# Patient Record
Sex: Female | Born: 1950 | Race: White | Hispanic: No | Marital: Married | Smoking: Never smoker
Health system: Southern US, Community
[De-identification: ages and names within clinical notes are randomized; demographics above are authoritative.]

## PROBLEM LIST (undated history)

## (undated) DIAGNOSIS — C801 Malignant (primary) neoplasm, unspecified: Secondary | ICD-10-CM

## (undated) DIAGNOSIS — E7800 Pure hypercholesterolemia, unspecified: Secondary | ICD-10-CM

## (undated) HISTORY — DX: Malignant (primary) neoplasm, unspecified: C80.1

## (undated) HISTORY — PX: ABDOMINAL HYSTERECTOMY: SHX81

## (undated) HISTORY — PX: MASTECTOMY: SHX3

## (undated) HISTORY — PX: BREAST LUMPECTOMY: SHX2

---

## 2008-02-13 ENCOUNTER — Ambulatory Visit: Payer: Self-pay

## 2011-04-23 DIAGNOSIS — K5730 Diverticulosis of large intestine without perforation or abscess without bleeding: Secondary | ICD-10-CM | POA: Insufficient documentation

## 2012-05-02 ENCOUNTER — Ambulatory Visit: Payer: Self-pay | Admitting: General Practice

## 2012-07-11 DIAGNOSIS — Z86010 Personal history of colonic polyps: Secondary | ICD-10-CM | POA: Insufficient documentation

## 2012-07-11 DIAGNOSIS — E7800 Pure hypercholesterolemia, unspecified: Secondary | ICD-10-CM | POA: Insufficient documentation

## 2012-07-11 DIAGNOSIS — R7301 Impaired fasting glucose: Secondary | ICD-10-CM | POA: Insufficient documentation

## 2012-08-14 DIAGNOSIS — M5441 Lumbago with sciatica, right side: Secondary | ICD-10-CM | POA: Insufficient documentation

## 2012-08-14 DIAGNOSIS — M419 Scoliosis, unspecified: Secondary | ICD-10-CM | POA: Insufficient documentation

## 2012-09-09 DIAGNOSIS — N3941 Urge incontinence: Secondary | ICD-10-CM | POA: Insufficient documentation

## 2012-10-10 ENCOUNTER — Ambulatory Visit: Payer: Self-pay | Admitting: General Practice

## 2013-04-21 DIAGNOSIS — M6530 Trigger finger, unspecified finger: Secondary | ICD-10-CM | POA: Insufficient documentation

## 2013-05-08 DIAGNOSIS — L821 Other seborrheic keratosis: Secondary | ICD-10-CM | POA: Insufficient documentation

## 2015-02-13 ENCOUNTER — Encounter (HOSPITAL_COMMUNITY): Payer: Self-pay | Admitting: Emergency Medicine

## 2015-02-13 ENCOUNTER — Emergency Department (INDEPENDENT_AMBULATORY_CARE_PROVIDER_SITE_OTHER): Payer: No Typology Code available for payment source

## 2015-02-13 ENCOUNTER — Emergency Department (INDEPENDENT_AMBULATORY_CARE_PROVIDER_SITE_OTHER)
Admission: EM | Admit: 2015-02-13 | Discharge: 2015-02-13 | Disposition: A | Discharge status: Home / Self Care | Payer: No Typology Code available for payment source | Source: Home / Self Care | Attending: Family Medicine | Admitting: Family Medicine

## 2015-02-13 DIAGNOSIS — J189 Pneumonia, unspecified organism: Secondary | ICD-10-CM | POA: Diagnosis not present

## 2015-02-13 MED ORDER — LIDOCAINE HCL (PF) 1 % IJ SOLN
INTRAMUSCULAR | Status: AC
  Filled 2015-02-13: qty 5

## 2015-02-13 MED ORDER — ALBUTEROL SULFATE (2.5 MG/3ML) 0.083% IN NEBU
INHALATION_SOLUTION | RESPIRATORY_TRACT | Status: AC
  Filled 2015-02-13: qty 6

## 2015-02-13 MED ORDER — PREDNISONE 20 MG PO TABS
60.0000 mg | ORAL_TABLET | Freq: Once | ORAL | Status: AC
  Administered 2015-02-13: 60 mg via ORAL

## 2015-02-13 MED ORDER — CEFDINIR 300 MG PO CAPS: 300.0000 mg | ORAL_CAPSULE | Freq: Two times a day (BID) | ORAL | Status: DC

## 2015-02-13 MED ORDER — IPRATROPIUM BROMIDE 0.02 % IN SOLN
0.5000 mg | Freq: Once | RESPIRATORY_TRACT | Status: AC
  Administered 2015-02-13: 0.5 mg via RESPIRATORY_TRACT

## 2015-02-13 MED ORDER — CEFTRIAXONE SODIUM 1 G IJ SOLR
INTRAMUSCULAR | Status: AC
  Filled 2015-02-13: qty 10

## 2015-02-13 MED ORDER — DOXYCYCLINE HYCLATE 100 MG PO CAPS: 100.0000 mg | ORAL_CAPSULE | Freq: Two times a day (BID) | ORAL | Status: DC

## 2015-02-13 MED ORDER — CEFTRIAXONE SODIUM 1 G IJ SOLR
1.0000 g | Freq: Once | INTRAMUSCULAR | Status: AC
  Administered 2015-02-13: 1 g via INTRAMUSCULAR

## 2015-02-13 MED ORDER — PREDNISONE 20 MG PO TABS
ORAL_TABLET | ORAL | Status: AC
  Filled 2015-02-13: qty 3

## 2015-02-13 MED ORDER — PREDNISONE 50 MG PO TABS: 50.0000 mg | ORAL_TABLET | Freq: Every day | ORAL | Status: DC

## 2015-02-13 MED ORDER — IPRATROPIUM BROMIDE 0.02 % IN SOLN
RESPIRATORY_TRACT | Status: AC
  Filled 2015-02-13: qty 2.5

## 2015-02-13 MED ORDER — ALBUTEROL SULFATE (2.5 MG/3ML) 0.083% IN NEBU
5.0000 mg | INHALATION_SOLUTION | Freq: Once | RESPIRATORY_TRACT | Status: AC
  Administered 2015-02-13: 5 mg via RESPIRATORY_TRACT

## 2015-02-13 NOTE — ED Notes (Signed)
Discussed discharge information with pt and her husband. Pt verbalized understanding of all information including prescriptions. Pt walked out with husband at time of discharge

## 2015-02-13 NOTE — Discharge Instructions (Signed)

## 2015-02-13 NOTE — ED Notes (Signed)
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 Pt had to stop taking antibiotics that were given r/t side affects that she was starting to have. Pt states that her cough is getting worse with no relief.

## 2015-02-13 NOTE — ED Provider Notes (Signed)
CSN: [id number]     Arrival date & time 02/13/15  1055 History   First MD Initiated Contact with Patient 02/13/15 1126     Chief Complaint  Patient presents with  . Cough   (Consider location/radiation/quality/duration/timing/severity/associated sxs/prior Treatment) HPI         64 year old female presents complaining of being sick now for about 10 days. She has cough, congestion, wheezing, chest pain with coughing. She also has sore throat from coughing.about 5 days ago she went to the employee health clinic at her job and they put her on Levaquin and an inhaler, she had to stop the Levaquin because she started to have severe body aches after taking it. Since she stopped it her cough is getting worse. She has only been using her inhaler in the mornings because she was told she can only use it in the mornings. No fever, NVD, or chest pain   History reviewed. No pertinent past medical history. History reviewed. No pertinent past surgical history. History reviewed. No pertinent family history. History  Substance Use Topics  . Smoking status: Never Smoker   . Smokeless tobacco: Not on file  . Alcohol Use: No   OB History    No data available     Review of Systems  Constitutional: Negative for fever and chills.  Respiratory: Positive for cough and wheezing. Negative for shortness of breath.   Cardiovascular: Negative for chest pain.  Gastrointestinal: Negative for nausea, vomiting, abdominal pain and diarrhea.  All other systems reviewed and are negative.   Allergies  Codeine  Home Medications   Prior to Admission medications   Medication Sig Start Date End Date Taking? Authorizing Provider  cefdinir (OMNICEF) 300 MG capsule Take 1 capsule (300 mg total) by mouth 2 (two) times daily. 02/13/15   Liam Graham, PA-C  doxycycline (VIBRAMYCIN) 100 MG capsule Take 1 capsule (100 mg total) by mouth 2 (two) times daily. 02/13/15   Liam Graham, PA-C  Ergocalciferol (VITAMIN D2 PO) Take  1.25 mg by mouth.   Yes Historical Provider, MD  predniSONE (DELTASONE) 50 MG tablet Take 1 tablet (50 mg total) by mouth daily with breakfast. 02/13/15   Liam Graham, PA-C  simvastatin (ZOCOR) 20 MG tablet Take 20 mg by mouth daily.   Yes Historical Provider, MD   BP 130/82 mmHg  Pulse 86  Temp(Src) 98.1 F (36.7 C) (Oral)  Resp 18  SpO2 96% Physical Exam  Constitutional: She is oriented to person, place, and time. Vital signs are normal. She appears well-developed and well-nourished. She appears ill. No distress.  HENT:  Head: Normocephalic and atraumatic.  Right Ear: External ear normal.  Left Ear: External ear normal.  Nose: Nose normal.  Mouth/Throat: Oropharynx is clear and moist. No oropharyngeal exudate.  Neck: Normal range of motion. Neck supple.  Cardiovascular: Normal rate, regular rhythm and normal heart sounds.   Pulmonary/Chest: She is in respiratory distress (mild). She has wheezes (Diffuse, expiratory).  Prolonged expiratory component  Lymphadenopathy:    She has no cervical adenopathy.  Neurological: She is alert and oriented to person, place, and time. She has normal strength. Coordination normal.  Skin: Skin is warm and dry. No rash noted. She is not diaphoretic.  Psychiatric: She has a normal mood and affect. Judgment normal.  Nursing note and vitals reviewed.   ED Course  Procedures (including critical care time) Labs Review Labs Reviewed - No data to display  Imaging Review Dg Chest 2 View  02/13/2015  CLINICAL DATA:  Cough and chest congestion for the past 10 days.  EXAM: CHEST  2 VIEW  COMPARISON:  None.  FINDINGS: Normal sized heart. Mild diffuse peribronchial thickening and prominence of the interstitial markings. Mild thoracic spine degenerative changes. Thoracolumbar spine Harrington rods.  IMPRESSION: No acute abnormality. Mild chronic bronchitic changes and mild chronic interstitial lung disease.   Electronically Signed   By: Claudie Revering M.D.    On: 02/13/2015 13:03     patient had a mild vasovagal reaction after deep breathing during the physical exam, this resolved in about 5 minutes  MDM   1. CAP (community acquired pneumonia)    X-ray reveals a right middle lobe infiltrate, with obscurred right heart border, although radiologist read is normal.  she will be given 1 g of Rocephin IM here and discharged with doxycycline and omnicef.  Will also use prednisone given her substantial amount of wheezing, she will continue her inhaler every 4 hours when necessary. Will schedule recheck in 2 days either here or at her primary care provider's office   Liam Graham, PA-C 02/13/15 1307

## 2015-02-16 ENCOUNTER — Emergency Department (INDEPENDENT_AMBULATORY_CARE_PROVIDER_SITE_OTHER)
Admission: EM | Admit: 2015-02-16 | Discharge: 2015-02-16 | Disposition: A | Discharge status: Home / Self Care | Payer: No Typology Code available for payment source | Source: Home / Self Care | Attending: Family Medicine | Admitting: Family Medicine

## 2015-02-16 ENCOUNTER — Emergency Department (INDEPENDENT_AMBULATORY_CARE_PROVIDER_SITE_OTHER): Payer: No Typology Code available for payment source

## 2015-02-16 ENCOUNTER — Encounter (HOSPITAL_COMMUNITY): Payer: Self-pay | Admitting: *Deleted

## 2015-02-16 DIAGNOSIS — J45901 Unspecified asthma with (acute) exacerbation: Secondary | ICD-10-CM

## 2015-02-16 MED ORDER — TRIAMCINOLONE ACETONIDE 40 MG/ML IJ SUSP
40.0000 mg | Freq: Once | INTRAMUSCULAR | Status: AC
  Administered 2015-02-16: 40 mg via INTRAMUSCULAR

## 2015-02-16 MED ORDER — IPRATROPIUM BROMIDE 0.02 % IN SOLN
RESPIRATORY_TRACT | Status: AC
  Filled 2015-02-16: qty 2.5

## 2015-02-16 MED ORDER — METHYLPREDNISOLONE ACETATE 40 MG/ML IJ SUSP
80.0000 mg | Freq: Once | INTRAMUSCULAR | Status: AC
  Administered 2015-02-16: 80 mg via INTRAMUSCULAR

## 2015-02-16 MED ORDER — METHYLPREDNISOLONE 4 MG PO KIT: PACK | ORAL | Status: DC

## 2015-02-16 MED ORDER — IPRATROPIUM BROMIDE 0.02 % IN SOLN
0.5000 mg | Freq: Once | RESPIRATORY_TRACT | Status: AC
  Administered 2015-02-16: 0.5 mg via RESPIRATORY_TRACT

## 2015-02-16 MED ORDER — METHYLPREDNISOLONE ACETATE 80 MG/ML IJ SUSP
INTRAMUSCULAR | Status: AC
  Filled 2015-02-16: qty 1

## 2015-02-16 MED ORDER — TRIAMCINOLONE ACETONIDE 40 MG/ML IJ SUSP
INTRAMUSCULAR | Status: AC
  Filled 2015-02-16: qty 1

## 2015-02-16 MED ORDER — ALBUTEROL SULFATE (2.5 MG/3ML) 0.083% IN NEBU
5.0000 mg | INHALATION_SOLUTION | Freq: Once | RESPIRATORY_TRACT | Status: AC
  Administered 2015-02-16: 5 mg via RESPIRATORY_TRACT

## 2015-02-16 MED ORDER — ALBUTEROL SULFATE (2.5 MG/3ML) 0.083% IN NEBU
INHALATION_SOLUTION | RESPIRATORY_TRACT | Status: AC
  Filled 2015-02-16: qty 6

## 2015-02-16 MED ORDER — IPRATROPIUM BROMIDE 0.06 % NA SOLN
2.0000 | Freq: Four times a day (QID) | NASAL | Status: DC
Start: 2015-02-16 — End: 2017-04-15

## 2015-02-16 NOTE — ED Notes (Signed)
Pt    Here  For  followup   Of  pnuemonia           She  Was   Seen [facility]       3   Days  Ago    And  Was  rx  2  Anti  Biotics             She  Continues  To  Have  A  Cough

## 2015-02-16 NOTE — ED Provider Notes (Signed)
CSN: [id number]     Arrival date & time 02/16/15  1301 History   First MD Initiated Contact with Patient 02/16/15 1327     Chief Complaint  Patient presents with  . Follow-up   (Consider location/radiation/quality/duration/timing/severity/associated sxs/prior Treatment) Patient is a 64 y.o. female presenting with cough. The history is provided by the patient and the spouse.  Cough Cough characteristics:  Non-productive, dry and harsh Severity:  Moderate Onset quality:  Gradual Duration:  2 weeks Timing:  Constant Progression:  Waxing and waning Chronicity:  New Smoker: no   Context: upper respiratory infection and weather changes   Relieved by:  Nothing Associated symptoms: chest pain, rhinorrhea, shortness of breath and wheezing   Associated symptoms: no fever     History reviewed. No pertinent past medical history. History reviewed. No pertinent past surgical history. History reviewed. No pertinent family history. History  Substance Use Topics  . Smoking status: Never Smoker   . Smokeless tobacco: Not on file  . Alcohol Use: No   OB History    No data available     Review of Systems  Constitutional: Negative.  Negative for fever.  HENT: Positive for congestion, postnasal drip and rhinorrhea.   Respiratory: Positive for cough, shortness of breath and wheezing.   Cardiovascular: Positive for chest pain.    Allergies  Codeine  Home Medications   Prior to Admission medications   Medication Sig Start Date End Date Taking? Authorizing Provider  cefdinir (OMNICEF) 300 MG capsule Take 1 capsule (300 mg total) by mouth 2 (two) times daily. 02/13/15   Liam Graham, PA-C  doxycycline (VIBRAMYCIN) 100 MG capsule Take 1 capsule (100 mg total) by mouth 2 (two) times daily. 02/13/15   Liam Graham, PA-C  Ergocalciferol (VITAMIN D2 PO) Take 1.25 mg by mouth.    Historical Provider, MD  ipratropium (ATROVENT) 0.06 % nasal spray Place 2 sprays into both nostrils 4 (four)  times daily. 02/16/15   Billy Fischer, MD  methylPREDNISolone (MEDROL DOSEPAK) 4 MG tablet follow package directions start on thurs, take until finished 02/16/15   Billy Fischer, MD  predniSONE (DELTASONE) 50 MG tablet Take 1 tablet (50 mg total) by mouth daily with breakfast. 02/13/15   Liam Graham, PA-C  simvastatin (ZOCOR) 20 MG tablet Take 20 mg by mouth daily.    Historical Provider, MD   BP 167/95 mmHg  Pulse 95  Temp(Src) 98 F (36.7 C) (Oral)  Resp 16  SpO2 98% Physical Exam  Constitutional: She appears well-nourished. No distress.  HENT:  Right Ear: External ear normal.  Left Ear: External ear normal.  Mouth/Throat: Oropharynx is clear and moist.  Eyes: Conjunctivae are normal. Pupils are equal, round, and reactive to light.  Neck: Normal range of motion. Neck supple.  Cardiovascular: Regular rhythm and normal heart sounds.   Pulmonary/Chest: No respiratory distress. She has wheezes. She has rhonchi. She has rales.  Lymphadenopathy:    She has no cervical adenopathy.  Skin: Skin is warm and dry.  Nursing note and vitals reviewed.   ED Course  Procedures (including critical care time) Labs Review Labs Reviewed - No data to display  Imaging Review Dg Chest 2 View  02/16/2015   CLINICAL DATA:  Cough and chest congestion for 2 weeks.  EXAM: CHEST  2 VIEW  COMPARISON:  02/13/2015  FINDINGS: The heart size and mediastinal contours are within normal limits. Mild central peribronchial thickening again noted. No evidence of pulmonary infiltrate or pleural  effusion. No mass or lymphadenopathy identified. Thoracolumbar posterior spinal fixation rods again seen.  IMPRESSION: No active cardiopulmonary disease.   Electronically Signed   By: Earle Gell M.D.   On: 02/16/2015 14:09     MDM   1. Asthmatic bronchitis with acute exacerbation    Lungs nearly clear at d/c.after neb.   Billy Fischer, MD 02/16/15 [phone]

## 2015-02-16 NOTE — Discharge Instructions (Signed)
Drink plenty of fluids as discussed, use medicine as prescribed, and mucinex or delsym for cough. Return or see your doctor if further problems °

## 2015-03-08 ENCOUNTER — Emergency Department: Payer: Self-pay | Admitting: Emergency Medicine

## 2015-03-31 DIAGNOSIS — D0510 Intraductal carcinoma in situ of unspecified breast: Secondary | ICD-10-CM | POA: Insufficient documentation

## 2015-04-08 DIAGNOSIS — Z833 Family history of diabetes mellitus: Secondary | ICD-10-CM | POA: Insufficient documentation

## 2015-04-28 DIAGNOSIS — C50919 Malignant neoplasm of unspecified site of unspecified female breast: Secondary | ICD-10-CM | POA: Insufficient documentation

## 2015-05-20 ENCOUNTER — Encounter: Payer: Self-pay | Admitting: Emergency Medicine

## 2015-05-20 ENCOUNTER — Emergency Department
Admission: EM | Admit: 2015-05-20 | Discharge: 2015-05-20 | Disposition: A | Discharge status: Home / Self Care | Payer: PRIVATE HEALTH INSURANCE | Attending: Emergency Medicine | Admitting: Emergency Medicine

## 2015-05-20 ENCOUNTER — Ambulatory Visit
Admission: RE | Admit: 2015-05-20 | Discharge: 2015-05-20 | Disposition: A | Discharge status: Home / Self Care | Payer: PRIVATE HEALTH INSURANCE | Source: Ambulatory Visit | Attending: Physician Assistant | Admitting: Physician Assistant

## 2015-05-20 ENCOUNTER — Other Ambulatory Visit: Payer: Self-pay | Admitting: Physician Assistant

## 2015-05-20 DIAGNOSIS — I82621 Acute embolism and thrombosis of deep veins of right upper extremity: Secondary | ICD-10-CM | POA: Diagnosis not present

## 2015-05-20 DIAGNOSIS — M25421 Effusion, right elbow: Secondary | ICD-10-CM | POA: Diagnosis present

## 2015-05-20 DIAGNOSIS — Z79899 Other long term (current) drug therapy: Secondary | ICD-10-CM | POA: Diagnosis not present

## 2015-05-20 DIAGNOSIS — Z792 Long term (current) use of antibiotics: Secondary | ICD-10-CM | POA: Insufficient documentation

## 2015-05-20 DIAGNOSIS — M79621 Pain in right upper arm: Secondary | ICD-10-CM | POA: Diagnosis present

## 2015-05-20 DIAGNOSIS — Z7952 Long term (current) use of systemic steroids: Secondary | ICD-10-CM | POA: Insufficient documentation

## 2015-05-20 DIAGNOSIS — I82629 Acute embolism and thrombosis of deep veins of unspecified upper extremity: Secondary | ICD-10-CM

## 2015-05-20 DIAGNOSIS — I82601 Acute embolism and thrombosis of unspecified veins of right upper extremity: Secondary | ICD-10-CM | POA: Insufficient documentation

## 2015-05-20 HISTORY — DX: Pure hypercholesterolemia, unspecified: E78.00

## 2015-05-20 LAB — CBC WITH DIFFERENTIAL/PLATELET
BASOS ABS: 0.1 10*3/uL (ref 0–0.1)
Basophils Relative: 1 %
EOS ABS: 0.1 10*3/uL (ref 0–0.7)
EOS PCT: 1 %
HCT: 37.4 % (ref 35.0–47.0)
HEMOGLOBIN: 12.7 g/dL (ref 12.0–16.0)
LYMPHS PCT: 28 %
Lymphs Abs: 2 10*3/uL (ref 1.0–3.6)
MCH: 28.8 pg (ref 26.0–34.0)
MCHC: 33.9 g/dL (ref 32.0–36.0)
MCV: 84.9 fL (ref 80.0–100.0)
MONO ABS: 1 10*3/uL — AB (ref 0.2–0.9)
Monocytes Relative: 14 %
Neutro Abs: 4 10*3/uL (ref 1.4–6.5)
Neutrophils Relative %: 56 %
Platelets: 211 10*3/uL (ref 150–440)
RBC: 4.41 MIL/uL (ref 3.80–5.20)
RDW: 15.3 % — ABNORMAL HIGH (ref 11.5–14.5)
WBC: 7.2 10*3/uL (ref 3.6–11.0)

## 2015-05-20 LAB — BASIC METABOLIC PANEL
ANION GAP: 10 (ref 5–15)
BUN: 18 mg/dL (ref 6–20)
CO2: 26 mmol/L (ref 22–32)
Calcium: 9.3 mg/dL (ref 8.9–10.3)
Chloride: 103 mmol/L (ref 101–111)
Creatinine, Ser: 0.86 mg/dL (ref 0.44–1.00)
GFR calc Af Amer: >60 mL/min (ref >60)
GFR calc non Af Amer: >60 mL/min (ref >60)
Glucose, Bld: 114 mg/dL — ABNORMAL HIGH (ref 65–99)
Potassium: 3.6 mmol/L (ref 3.5–5.1)
Sodium: 139 mmol/L (ref 135–145)

## 2015-05-20 LAB — PROTIME-INR
INR: 0.94
Prothrombin Time: 12.8 seconds (ref 11.4–15.0)

## 2015-05-20 MED ORDER — APIXABAN 5 MG PO TABS: 10.0000 mg | ORAL_TABLET | Freq: Two times a day (BID) | ORAL | Status: DC

## 2015-05-20 NOTE — ED Provider Notes (Signed)
[facility] Emergency Department Provider Note  ____________________________________________  Time seen: Approximately 8:00 PM  I have reviewed the triage vital signs and the nursing notes.   HISTORY  Chief Complaint Extremity Pain    HPI Tina Powers is a 64 y.o. female who went to her doctor because she felt a lump in her right upper arm yesterday. She had an ultrasound which showed a DVT in the right upper arm pain. Past medical history history is significant for bilateral mastectomy for cancer and 4 weeks ago. Stressed patient briefly with Dr. Bridgett Larsson and we will start her on L course 10 mg twice a day for 7 days and then decreased to 5 mg twice a day for. Patient will follow up with her regular doctor. Patient has not had any chest pain aside from that related to her mastectomy and that has not changed in character. She has not had any shortness of breath coughing or any other complaints that would suggest a pulmonary embolus.   Past Medical History  Diagnosis Date  . High cholesterol     There are no active problems to display for this patient.   Past Surgical History  Procedure Laterality Date  . Breast lumpectomy    . Abdominal hysterectomy    . Mastectomy      Current Outpatient Rx  Name  Route  Sig  Dispense  Refill  . cefdinir (OMNICEF) 300 MG capsule   Oral   Take 1 capsule (300 mg total) by mouth 2 (two) times daily.   14 capsule   0   . doxycycline (VIBRAMYCIN) 100 MG capsule   Oral   Take 1 capsule (100 mg total) by mouth 2 (two) times daily.   14 capsule   0   . Ergocalciferol (VITAMIN D2 PO)   Oral   Take 1.25 mg by mouth.         Marland Kitchen ipratropium (ATROVENT) 0.06 % nasal spray   Each Nare   Place 2 sprays into both nostrils 4 (four) times daily.   15 mL   1   . methylPREDNISolone (MEDROL DOSEPAK) 4 MG tablet      follow package directions start on thurs, take until finished   21 tablet   0   . predniSONE (DELTASONE) 50  MG tablet   Oral   Take 1 tablet (50 mg total) by mouth daily with breakfast.   4 tablet   0   . simvastatin (ZOCOR) 20 MG tablet   Oral   Take 20 mg by mouth daily.           Allergies Codeine  No family history on file.  Social History History  Substance Use Topics  . Smoking status: Never Smoker   . Smokeless tobacco: Not on file  . Alcohol Use: No    Review of Systems Constitutional: No fever/chills Eyes: No visual changes. ENT: No sore throat. Cardiovascular: Denies chest pain. Except for that related to her mastectomy Respiratory: Denies shortness of breath. Gastrointestinal: No abdominal pain.  No nausea, no vomiting.  No diarrhea.  No constipation. Genitourinary: Negative for dysuria. Musculoskeletal: Negative for back pain. Skin: Negative for rash. Neurological: Negative for headaches, focal weakness or numbness.  10-point ROS otherwise negative.  ____________________________________________   PHYSICAL EXAM:  VITAL SIGNS: ED Triage Vitals  Enc Vitals Group     BP --      Pulse Rate 05/20/15 1533 91     Resp 05/20/15 1533 18  Temp 05/20/15 1533 98.5 F (36.9 C)     Temp Source 05/20/15 1533 Oral     SpO2 05/20/15 1533 98 %     Weight 05/20/15 1533 152 lb (68.947 kg)     Height 05/20/15 1533 5' (1.524 m)     Head Cir --      Peak Flow --      Pain Score --      Pain Loc --      Pain Edu? --      Excl. in [place]? --     Constitutional: Alert and oriented. Well appearing and in no acute distress. Eyes: Conjunctivae are normal. PERRL. EOMI. Head: Atraumatic. Nose: No congestion/rhinnorhea. Mouth/Throat: Mucous membranes are moist.  Oropharynx non-erythematous. Neck: No stridor.   }Cardiovascular: Normal rate, regular rhythm. Grossly normal heart sounds.  Good peripheral circulation. Respiratory: Normal respiratory effort.  No retractions. Lungs CTAB. Gastrointestinal: Soft and nontender. No distention. No abdominal bruits. No CVA  tenderness. Musculoskeletal: No lower extremity tenderness nor edema.  No joint effusions. Patient has a swollen somewhat tender and firm area in the right upper arm proximal to the elbow and will pulse distally Neurologic:  Normal speech and language. No gross focal neurologic deficits are appreciated. Speech is normal. No gait instability. Skin:  Skin is warm, dry and intact. No rash noted.   ____________________________________________   LABS (all labs ordered are listed, but only abnormal results are displayed)  Labs Reviewed  CBC WITH DIFFERENTIAL/PLATELET  BASIC METABOLIC PANEL  PROTIME-INR   ____________________________________________  EKG   ____________________________________________  RADIOLOGY  Ultrasound shows DVT as noted above ____________________________________________   PROCEDURES  Procedure(s) performed: None  Critical Care performed: No  ____________________________________________   INITIAL IMPRESSION / ASSESSMENT AND PLAN / ED COURSE  Pertinent labs & imaging results that were available during my care of the patient were reviewed by me and considered in my medical decision making (see chart for details).  Should note the patient is extremely anxious about having her blood pressure checks we managed to get it checked in her leg and it was high at 203/95 patient reports that it was normal 130/80 M believe she said in the office and she has had it checked in the leg several times and was told it was normal she is also fussing because we checked the blood pressure below the knee and set up above the knee any rate I will let her go with the Eliquis close follow-up with her doctor        ____________________________________________   FINAL CLINICAL IMPRESSION(S) / ED DIAGNOSES  Final diagnoses:  DVT of upper extremity (deep vein thrombosis), unspecified laterality     Nena Polio, MD 05/20/15 2159

## 2015-05-20 NOTE — ED Notes (Signed)
Pt presents to ED from Dr. Maralyn Sago office for [place]. Pt reports that she found a swollen area to her right upper arm yesterday. Pt denies pain to arm. Pt is awake and alert during triage. Pt refuses to have BP checked stating, "they just took it this morning at employee health."

## 2015-05-20 NOTE — Discharge Instructions (Signed)
Deep Vein Thrombosis °A deep vein thrombosis (DVT) is a blood clot that develops in the deep, larger veins of the leg, arm, or pelvis. These are more dangerous than clots that might form in veins near the surface of the body. A DVT can lead to serious and even life-threatening complications if the clot breaks off and travels in the bloodstream to the lungs.  °A DVT can damage the valves in your leg veins so that instead of flowing upward, the blood pools in the lower leg. This is called post-thrombotic syndrome, and it can result in pain, swelling, discoloration, and sores on the leg. °CAUSES °Usually, several things contribute to the formation of blood clots. Contributing factors include: °· The flow of blood slows down. °· The inside of the vein is damaged in some way. °· You have a condition that makes blood clot more easily. °RISK FACTORS °Some people are more likely than others to develop blood clots. Risk factors include:  °· Smoking. °· Being overweight (obese). °· Sitting or lying still for a long time. This includes long-distance travel, paralysis, or recovery from an illness or surgery. °Other factors that increase risk are:  °· Older age, especially over 75 years of age. °· Having a family history of blood clots or if you have already had a blot clot. °· Having major or lengthy surgery. This is especially true for surgery on the hip, knee, or belly (abdomen). Hip surgery is particularly high risk. °· Having a long, thin tube (catheter) placed inside a vein during a medical procedure. °· Breaking a hip or leg. °· Having cancer or cancer treatment. °· Pregnancy and childbirth. °¨ Hormone changes make the blood clot more easily during pregnancy. °¨ The fetus puts pressure on the veins of the pelvis. °¨ There is a risk of injury to veins during delivery or a caesarean delivery. The risk is highest just after childbirth. °· Medicines containing the female hormone estrogen. This includes birth control pills and  hormone replacement therapy. °· Other circulation or heart problems. ° °SIGNS AND SYMPTOMS °When a clot forms, it can either partially or totally block the blood flow in that vein. Symptoms of a DVT can include: °· Swelling of the leg or arm, especially if one side is much worse. °· Warmth and redness of the leg or arm, especially if one side is much worse. °· Pain in an arm or leg. If the clot is in the leg, symptoms may be more noticeable or worse when standing or walking. °The symptoms of a DVT that has traveled to the lungs (pulmonary embolism, PE) usually start suddenly and include: °· Shortness of breath. °· Coughing. °· Coughing up blood or blood-tinged mucus. °· Chest pain. The chest pain is often worse with deep breaths. °· Rapid heartbeat. °Anyone with these symptoms should get emergency medical treatment right away. Do not wait to see if the symptoms will go away. Call your local emergency services (911 in the [place] if you have these symptoms. Do not drive yourself to the hospital. °DIAGNOSIS °If a DVT is suspected, your health care provider will take a full medical history and perform a physical exam. Tests that also may be required include: °· Blood tests, including studies of the clotting properties of the blood. °· Ultrasound to see if you have clots in your legs or lungs. °· X-rays to show the flow of blood when dye is injected into the veins (venogram). °· Studies of your lungs if you have any   chest symptoms. °PREVENTION °· Exercise the legs regularly. Take a brisk 30-minute walk every day. °· Maintain a weight that is appropriate for your height. °· Avoid sitting or lying in bed for long periods of time without moving your legs. °· Women, particularly those over the age of 35 years, should consider the risks and benefits of taking estrogen medicines, including birth control pills. °· Do not smoke, especially if you take estrogen medicines. °· Long-distance travel can increase your risk of DVT. You  should exercise your legs by walking or pumping the muscles every hour. °· Many of the risk factors above relate to situations that exist with hospitalization, either for illness, injury, or elective surgery. Prevention may include medical and nonmedical measures. °¨ Your health care provider will assess you for the need for venous thromboembolism prevention when you are admitted to the hospital. If you are having surgery, your surgeon will assess you the day of or day after surgery. °TREATMENT °Once identified, a DVT can be treated. It can also be prevented in some circumstances. Once you have had a DVT, you may be at increased risk for a DVT in the future. The most common treatment for DVT is blood-thinning (anticoagulant) medicine, which reduces the blood's tendency to clot. Anticoagulants can stop new blood clots from forming and stop old clots from growing. They cannot dissolve existing clots. Your body does this by itself over time. Anticoagulants can be given by mouth, through an IV tube, or by injection. Your health care provider will determine the best program for you. Other medicines or treatments that may be used are: °· Heparin or related medicines (low molecular weight heparin) are often the first treatment for a blood clot. They act quickly. However, they cannot be taken orally and must be given either in shot form or by IV tube. °¨ Heparin can cause a fall in a component of blood that stops bleeding and forms blood clots (platelets). You will be monitored with blood tests to be sure this does not occur. °· Warfarin is an anticoagulant that can be swallowed. It takes a few days to start working, so usually heparin or related medicines are used in combination. Once warfarin is working, heparin is usually stopped. °· Factor Xa inhibitor medicines, such as rivaroxaban and apixaban, also reduce blood clotting. These medicines are taken orally and can often be used without heparin or related  medicines. °· Less commonly, clot dissolving drugs (thrombolytics) are used to dissolve a DVT. They carry a high risk of bleeding, so they are used mainly in severe cases where your life or a part of your body is threatened. °· Very rarely, a blood clot in the leg needs to be removed surgically. °· If you are unable to take anticoagulants, your health care provider may arrange for you to have a filter placed in a main vein in your abdomen. This filter prevents clots from traveling to your lungs. °HOME CARE INSTRUCTIONS °· Take all medicines as directed by your health care provider. °· Learn as much as you can about DVT. °· Wear a medical alert bracelet or carry a medical alert card. °· Ask your health care provider how soon you can go back to normal activities. It is important to stay active to prevent blood clots. If you are on anticoagulant medicine, avoid contact sports. °· It is very important to exercise. This is especially important while traveling, sitting, or standing for long periods of time. Exercise your legs by walking or by   tightening and relaxing your leg muscles regularly. Take frequent walks. °· You may need to wear compression stockings. These are tight elastic stockings that apply pressure to the lower legs. This pressure can help keep the blood in the legs from clotting. °Taking Warfarin °Warfarin is a daily medicine that is taken by mouth. Your health care provider will advise you on the length of treatment (usually 3-6 months, sometimes lifelong). If you take warfarin: °· Understand how to take warfarin and foods that can affect how warfarin works in your body. °· Too much and too little warfarin are both dangerous. Too much warfarin increases the risk of bleeding. Too little warfarin continues to allow the risk for blood clots. °Warfarin and Regular Blood Testing °While taking warfarin, you will need to have regular blood tests to measure your blood clotting time. These blood tests usually  include both the prothrombin time (PT) and international normalized ratio (INR) tests. The PT and INR results allow your health care provider to adjust your dose of warfarin. It is very important that you have your PT and INR tested as often as directed by your health care provider.    °Warfarin and Your Diet °Avoid major changes in your diet, or notify your health care provider before changing your diet. Arrange a visit with a registered dietitian to answer your questions. Many foods, especially foods high in vitamin K, can interfere with warfarin and affect the PT and INR results. You should eat a consistent amount of foods high in vitamin K. Foods high in vitamin K include:  °· Spinach, kale, broccoli, cabbage, collard and turnip greens, Brussels sprouts, peas, cauliflower, seaweed, and parsley. °· Beef and pork liver. °· Green tea. °· Soybean oil. °Warfarin with Other Medicines °Many medicines can interfere with warfarin and affect the PT and INR results. You must: °· Tell your health care provider about any and all medicines, vitamins, and supplements you take, including aspirin and other over-the-counter anti-inflammatory medicines. Be especially cautious with aspirin and anti-inflammatory medicines. Ask your health care provider before taking these. °· Do not take or discontinue any prescribed or over-the-counter medicine except on the advice of your health care provider or pharmacist. °Warfarin Side Effects °Warfarin can have side effects, such as easy bruising and difficulty stopping bleeding. Ask your health care provider or pharmacist about other side effects of warfarin. You will need to: °· Hold pressure over cuts for longer than usual. °· Notify your dentist and other health care providers that you are taking warfarin before you undergo any procedures where bleeding may occur. °Warfarin with Alcohol and Tobacco  °· Drinking alcohol frequently can increase the effect of warfarin, leading to excess  bleeding. It is best to avoid alcoholic drinks or to consume only very small amounts while taking warfarin. Notify your health care provider if you change your alcohol intake.   °· Do not use any tobacco products including cigarettes, chewing tobacco, or electronic cigarettes. If you smoke, quit. Ask your health care provider for help with quitting smoking. °Alternative Medicines to Warfarin: Factor Xa Inhibitor Medicines °· These blood-thinning medicines are taken by mouth, usually for several weeks or longer. It is important to take the medicine every single day at the same time each day. °· There are no regular blood tests required when using these medicines. °· There are fewer food and drug interactions than with warfarin. °· The side effects of this class of medicine are similar to those of warfarin, including excessive bruising or bleeding. Ask your   health care provider or pharmacist about other potential side effects. SEEK MEDICAL CARE IF:  You notice a rapid heartbeat.  You feel weaker or more tired than usual.  You feel faint.  You notice increased bruising.  You feel your symptoms are not getting better in the time expected.  You believe you are having side effects of medicine. SEEK IMMEDIATE MEDICAL CARE IF:  You have chest pain.  You have trouble breathing.  You have new or increased swelling or pain in one leg.  You cough up blood.  You notice blood in vomit, in a bowel movement, or in urine. MAKE SURE YOU:  Understand these instructions.  Will watch your condition.  Will get help right away if you are not doing well or get worse. Document Released: 12/10/2005 Document Revised: 04/26/2014 Document Reviewed: 08/17/2013 [facility] Patient Information 2015 [place], [place]. This information is not intended to replace advice given to you by your health care provider. Make sure you discuss any questions you have with your health care provider.  Deep Vein Thrombosis A deep  vein thrombosis (DVT) is a blood clot that develops in the deep, larger veins of the leg, arm, or pelvis. These are more dangerous than clots that might form in veins near the surface of the body. A DVT can lead to serious and even life-threatening complications if the clot breaks off and travels in the bloodstream to the lungs.  A DVT can damage the valves in your leg veins so that instead of flowing upward, the blood pools in the lower leg. This is called post-thrombotic syndrome, and it can result in pain, swelling, discoloration, and sores on the leg. CAUSES Usually, several things contribute to the formation of blood clots. Contributing factors include:  The flow of blood slows down.  The inside of the vein is damaged in some way.  You have a condition that makes blood clot more easily. RISK FACTORS Some people are more likely than others to develop blood clots. Risk factors include:   Smoking.  Being overweight (obese).  Sitting or lying still for a long time. This includes long-distance travel, paralysis, or recovery from an illness or surgery. Other factors that increase risk are:   Older age, especially over 53 years of age.  Having a family history of blood clots or if you have already had a blot clot.  Having major or lengthy surgery. This is especially true for surgery on the hip, knee, or belly (abdomen). Hip surgery is particularly high risk.  Having a long, thin tube (catheter) placed inside a vein during a medical procedure.  Breaking a hip or leg.  Having cancer or cancer treatment.  Pregnancy and childbirth.  Hormone changes make the blood clot more easily during pregnancy.  The fetus puts pressure on the veins of the pelvis.  There is a risk of injury to veins during delivery or a caesarean delivery. The risk is highest just after childbirth.  Medicines containing the female hormone estrogen. This includes birth control pills and hormone replacement  therapy.  Other circulation or heart problems.  SIGNS AND SYMPTOMS When a clot forms, it can either partially or totally block the blood flow in that vein. Symptoms of a DVT can include:  Swelling of the leg or arm, especially if one side is much worse.  Warmth and redness of the leg or arm, especially if one side is much worse.  Pain in an arm or leg. If the clot is in the leg,  symptoms may be more noticeable or worse when standing or walking. The symptoms of a DVT that has traveled to the lungs (pulmonary embolism, PE) usually start suddenly and include:  Shortness of breath.  Coughing.  Coughing up blood or blood-tinged mucus.  Chest pain. The chest pain is often worse with deep breaths.  Rapid heartbeat. Anyone with these symptoms should get emergency medical treatment right away. Do not wait to see if the symptoms will go away. Call your local emergency services (911 in the [place] if you have these symptoms. Do not drive yourself to the hospital. DIAGNOSIS If a DVT is suspected, your health care provider will take a full medical history and perform a physical exam. Tests that also may be required include:  Blood tests, including studies of the clotting properties of the blood.  Ultrasound to see if you have clots in your legs or lungs.  X-rays to show the flow of blood when dye is injected into the veins (venogram).  Studies of your lungs if you have any chest symptoms. PREVENTION  Exercise the legs regularly. Take a brisk 30-minute walk every day.  Maintain a weight that is appropriate for your height.  Avoid sitting or lying in bed for long periods of time without moving your legs.  Women, particularly those over the age of 71 years, should consider the risks and benefits of taking estrogen medicines, including birth control pills.  Do not smoke, especially if you take estrogen medicines.  Long-distance travel can increase your risk of DVT. You should exercise your  legs by walking or pumping the muscles every hour.  Many of the risk factors above relate to situations that exist with hospitalization, either for illness, injury, or elective surgery. Prevention may include medical and nonmedical measures.  Your health care provider will assess you for the need for venous thromboembolism prevention when you are admitted to the hospital. If you are having surgery, your surgeon will assess you the day of or day after surgery. TREATMENT Once identified, a DVT can be treated. It can also be prevented in some circumstances. Once you have had a DVT, you may be at increased risk for a DVT in the future. The most common treatment for DVT is blood-thinning (anticoagulant) medicine, which reduces the blood's tendency to clot. Anticoagulants can stop new blood clots from forming and stop old clots from growing. They cannot dissolve existing clots. Your body does this by itself over time. Anticoagulants can be given by mouth, through an IV tube, or by injection. Your health care provider will determine the best program for you. Other medicines or treatments that may be used are:  Heparin or related medicines (low molecular weight heparin) are often the first treatment for a blood clot. They act quickly. However, they cannot be taken orally and must be given either in shot form or by IV tube.  Heparin can cause a fall in a component of blood that stops bleeding and forms blood clots (platelets). You will be monitored with blood tests to be sure this does not occur.  Warfarin is an anticoagulant that can be swallowed. It takes a few days to start working, so usually heparin or related medicines are used in combination. Once warfarin is working, heparin is usually stopped.  Factor Xa inhibitor medicines, such as rivaroxaban and apixaban, also reduce blood clotting. These medicines are taken orally and can often be used without heparin or related medicines.  Less commonly, clot  dissolving drugs (thrombolytics) are  used to dissolve a DVT. They carry a high risk of bleeding, so they are used mainly in severe cases where your life or a part of your body is threatened.  Very rarely, a blood clot in the leg needs to be removed surgically.  If you are unable to take anticoagulants, your health care provider may arrange for you to have a filter placed in a main vein in your abdomen. This filter prevents clots from traveling to your lungs. HOME CARE INSTRUCTIONS  Take all medicines as directed by your health care provider.  Learn as much as you can about DVT.  Wear a medical alert bracelet or carry a medical alert card.  Ask your health care provider how soon you can go back to normal activities. It is important to stay active to prevent blood clots. If you are on anticoagulant medicine, avoid contact sports.  It is very important to exercise. This is especially important while traveling, sitting, or standing for long periods of time. Exercise your legs by walking or by tightening and relaxing your leg muscles regularly. Take frequent walks.  You may need to wear compression stockings. These are tight elastic stockings that apply pressure to the lower legs. This pressure can help keep the blood in the legs from clotting. Taking Warfarin Warfarin is a daily medicine that is taken by mouth. Your health care provider will advise you on the length of treatment (usually 3-6 months, sometimes lifelong). If you take warfarin:  Understand how to take warfarin and foods that can affect how warfarin works in Veterinary surgeon.  Too much and too little warfarin are both dangerous. Too much warfarin increases the risk of bleeding. Too little warfarin continues to allow the risk for blood clots. Warfarin and Regular Blood Testing While taking warfarin, you will need to have regular blood tests to measure your blood clotting time. These blood tests usually include both the prothrombin time (PT)  and international normalized ratio (INR) tests. The PT and INR results allow your health care provider to adjust your dose of warfarin. It is very important that you have your PT and INR tested as often as directed by your health care provider.  Warfarin and Your Diet Avoid major changes in your diet, or notify your health care provider before changing your diet. Arrange a visit with a registered dietitian to answer your questions. Many foods, especially foods high in vitamin K, can interfere with warfarin and affect the PT and INR results. You should eat a consistent amount of foods high in vitamin K. Foods high in vitamin K include:   Spinach, kale, broccoli, cabbage, collard and turnip greens, Brussels sprouts, peas, cauliflower, seaweed, and parsley.  Beef and pork liver.  Green tea.  Soybean oil. Warfarin with Other Medicines Many medicines can interfere with warfarin and affect the PT and INR results. You must:  Tell your health care provider about any and all medicines, vitamins, and supplements you take, including aspirin and other over-the-counter anti-inflammatory medicines. Be especially cautious with aspirin and anti-inflammatory medicines. Ask your health care provider before taking these.  Do not take or discontinue any prescribed or over-the-counter medicine except on the advice of your health care provider or pharmacist. Warfarin Side Effects Warfarin can have side effects, such as easy bruising and difficulty stopping bleeding. Ask your health care provider or pharmacist about other side effects of warfarin. You will need to:  Hold pressure over cuts for longer than usual.  Notify your dentist and  other health care providers that you are taking warfarin before you undergo any procedures where bleeding may occur. Warfarin with Alcohol and Tobacco   Drinking alcohol frequently can increase the effect of warfarin, leading to excess bleeding. It is best to avoid alcoholic  drinks or to consume only very small amounts while taking warfarin. Notify your health care provider if you change your alcohol intake.   Do not use any tobacco products including cigarettes, chewing tobacco, or electronic cigarettes. If you smoke, quit. Ask your health care provider for help with quitting smoking. Alternative Medicines to Warfarin: Factor Xa Inhibitor Medicines  These blood-thinning medicines are taken by mouth, usually for several weeks or longer. It is important to take the medicine every single day at the same time each day.  There are no regular blood tests required when using these medicines.  There are fewer food and drug interactions than with warfarin.  The side effects of this class of medicine are similar to those of warfarin, including excessive bruising or bleeding. Ask your health care provider or pharmacist about other potential side effects. SEEK MEDICAL CARE IF:  You notice a rapid heartbeat.  You feel weaker or more tired than usual.  You feel faint.  You notice increased bruising.  You feel your symptoms are not getting better in the time expected.  You believe you are having side effects of medicine. SEEK IMMEDIATE MEDICAL CARE IF:  You have chest pain.  You have trouble breathing.  You have new or increased swelling or pain in one leg.  You cough up blood.  You notice blood in vomit, in a bowel movement, or in urine. MAKE SURE YOU:  Understand these instructions.  Will watch your condition.  Will get help right away if you are not doing well or get worse. Document Released: 12/10/2005 Document Revised: 04/26/2014 Document Reviewed: 08/17/2013 [facility] Patient Information 2015 [place], [place]. This information is not intended to replace advice given to you by your health care provider. Make sure you discuss any questions you have with your health care provider.  Please take 2 of the 5 mg Eliquis pills 2 x a day for 7 days then take 1  of the 5 mg Eliquis 2 x a day.  Please follow up with your doctor in the next few days for a recheck to see how you are doing.  Please return at once for bruising or bleeding including blood in the stool. The Eliquis is an anticoagulant and will make you bleed very easily.

## 2015-05-20 NOTE — ED Notes (Signed)
Attempt x 1 to draw pt's blood by EDT unsuccessful. Discussed with pt, pt prefers to wait until she is seen by MD to re-attempt blood draw r/t recent bilateral mastectomy. Charge RN aware of pt. Pt escorted to lobby, instructed to notify RN at front desk with any changes or if she decides she wants to re-attempt blood draw.

## 2015-05-20 NOTE — ED Notes (Signed)
Discussed pt with Thomasene Lot, MD. Orders placed.

## 2015-05-20 NOTE — ED Notes (Signed)
Pt. States she had a bilateral mastectomy 4 weeks ago.  Pt. Noticed lump in LUE yesterday.  Pt. Went for ultra sound today here at [facility] and clot was identified.  Pt. States pain upon palpation to bicept of rt. Arm.

## 2015-05-26 ENCOUNTER — Ambulatory Visit: Payer: No Typology Code available for payment source

## 2015-05-26 DIAGNOSIS — I82409 Acute embolism and thrombosis of unspecified deep veins of unspecified lower extremity: Secondary | ICD-10-CM | POA: Insufficient documentation

## 2015-05-26 DIAGNOSIS — I10 Essential (primary) hypertension: Secondary | ICD-10-CM | POA: Insufficient documentation

## 2015-06-14 DIAGNOSIS — F329 Major depressive disorder, single episode, unspecified: Secondary | ICD-10-CM | POA: Insufficient documentation

## 2015-06-14 DIAGNOSIS — F32A Depression, unspecified: Secondary | ICD-10-CM | POA: Insufficient documentation

## 2015-10-03 ENCOUNTER — Ambulatory Visit: Payer: Self-pay | Admitting: Physician Assistant

## 2015-10-03 ENCOUNTER — Encounter: Payer: Self-pay | Admitting: Physician Assistant

## 2015-10-03 VITALS — HR 115 | Temp 98.3°F

## 2015-10-03 DIAGNOSIS — N390 Urinary tract infection, site not specified: Secondary | ICD-10-CM

## 2015-10-03 DIAGNOSIS — R319 Hematuria, unspecified: Principal | ICD-10-CM

## 2015-10-03 LAB — POCT URINALYSIS DIPSTICK
Bilirubin, UA: NEGATIVE
Glucose, UA: NEGATIVE
Ketones, UA: NEGATIVE
NITRITE UA: NEGATIVE
PH UA: 5
PROTEIN UA: NEGATIVE
Spec Grav, UA: 1.02
Urobilinogen, UA: 0.2

## 2015-10-03 MED ORDER — CIPROFLOXACIN HCL 500 MG PO TABS: 500.0000 mg | ORAL_TABLET | Freq: Two times a day (BID) | ORAL | Status: DC

## 2015-10-03 NOTE — Progress Notes (Signed)
S:  C/o uti sx for 2 days, burning, urgency, frequency, denies fever, chills, vaginal discharge, abdominal pain or flank pain:  Remainder ros neg  O:  Vitals wnl, nad, no cva tenderness,  lungs c t a,cv rrr, n/v intact ua 2+ leuks, 1+ blood  A: uti  P: cipro 500 mg bid x 3d, increase water intake, add cranberry juice, return if not improving in 2 -3 days, return earlier if worsening, discussed pyelonephritis sx

## 2015-10-05 LAB — URINE CULTURE

## 2015-10-10 ENCOUNTER — Ambulatory Visit: Payer: Self-pay | Admitting: Physician Assistant

## 2015-10-10 VITALS — HR 92 | Temp 97.9°F

## 2015-10-10 DIAGNOSIS — R300 Dysuria: Secondary | ICD-10-CM

## 2015-10-10 LAB — POCT URINALYSIS DIPSTICK
Bilirubin, UA: NEGATIVE
Glucose, UA: NEGATIVE
Ketones, UA: NEGATIVE
NITRITE UA: NEGATIVE
PROTEIN UA: NEGATIVE
Spec Grav, UA: 1.025
Urobilinogen, UA: 0.2
pH, UA: 5.5

## 2015-10-10 MED ORDER — PHENAZOPYRIDINE HCL 200 MG PO TABS: 200.0000 mg | ORAL_TABLET | Freq: Three times a day (TID) | ORAL | Status: DC | PRN

## 2015-10-10 NOTE — Progress Notes (Signed)
  Dysuria  Subjective:    Patient ID: Tina Powers, female    DOB: 05/11/1951, 64 y.o.   MRN: [id number]  HPI Patient seen last week diagnosed and treated for UTI with Cipro.  States most of her symptoms are resolved except for dysuria.     Review of Systems Negative for compliant.     Objective:   Physical Exam   Dip UA show trace of leukocytes.      Assessment & Plan:  Resolving UTI.  Prescription for Pyridium.

## 2015-10-18 DIAGNOSIS — R600 Localized edema: Secondary | ICD-10-CM | POA: Insufficient documentation

## 2015-11-05 ENCOUNTER — Other Ambulatory Visit: Payer: Self-pay | Admitting: Physician Assistant

## 2015-12-05 ENCOUNTER — Ambulatory Visit: Payer: Self-pay | Admitting: Physician Assistant

## 2015-12-05 VITALS — HR 109 | Temp 98.6°F

## 2015-12-05 DIAGNOSIS — N390 Urinary tract infection, site not specified: Secondary | ICD-10-CM

## 2015-12-05 DIAGNOSIS — K5900 Constipation, unspecified: Secondary | ICD-10-CM

## 2015-12-05 LAB — POCT URINALYSIS DIPSTICK
BILIRUBIN UA: NEGATIVE
Glucose, UA: NEGATIVE
Ketones, UA: NEGATIVE
Nitrite, UA: NEGATIVE
Spec Grav, UA: 1.025
UROBILINOGEN UA: 0.2
pH, UA: 5.5

## 2015-12-05 MED ORDER — SULFAMETHOXAZOLE-TRIMETHOPRIM 800-160 MG PO TABS: 1.0000 | ORAL_TABLET | Freq: Two times a day (BID) | ORAL | Status: DC

## 2015-12-05 NOTE — Progress Notes (Signed)
S: c/o uti sx, urgency, freq, no fever/chills, no pain; also still has knot in r upper arm where dvt is, concerned whether she can stop eliquis or not, also having difficulty with bm, if sleeps on her left side was having the urge to have bm in the mornings, now isn't going but maybe once a week which is unusual for her, sometimes looks like pencils and sometimes its normal, no diarrhea  O: vitals wnl, nad, abd soft nontender bs normal all 4 quads, ua  + leuks and blood  A: uti, constipation and change in bowl movements  P: septra ds 1 po bid x 7d, for uti; miralax for constipation, call pcp at [facility] to be referred to GI, concerned about pencil-like stools, pt states she will call them, f/u with oncologist aboutt dvt in arm and whether she should see vascular or have an [place] prior to stopping the med; urine culture ordered

## 2015-12-06 ENCOUNTER — Ambulatory Visit
Admission: RE | Admit: 2015-12-06 | Discharge: 2015-12-06 | Disposition: A | Discharge status: Home / Self Care | Payer: PRIVATE HEALTH INSURANCE | Source: Ambulatory Visit | Attending: Physician Assistant | Admitting: Physician Assistant

## 2015-12-06 DIAGNOSIS — K5900 Constipation, unspecified: Secondary | ICD-10-CM | POA: Insufficient documentation

## 2015-12-06 NOTE — Addendum Note (Signed)
Addended by: Versie Starks on: 12/06/2015 10:58 AM   Modules accepted: Orders

## 2015-12-07 DIAGNOSIS — Z1211 Encounter for screening for malignant neoplasm of colon: Secondary | ICD-10-CM | POA: Insufficient documentation

## 2015-12-07 DIAGNOSIS — K5900 Constipation, unspecified: Secondary | ICD-10-CM | POA: Insufficient documentation

## 2015-12-07 DIAGNOSIS — N390 Urinary tract infection, site not specified: Secondary | ICD-10-CM | POA: Insufficient documentation

## 2015-12-07 DIAGNOSIS — M7989 Other specified soft tissue disorders: Secondary | ICD-10-CM | POA: Insufficient documentation

## 2015-12-07 LAB — URINE CULTURE

## 2015-12-07 NOTE — Progress Notes (Signed)
Patient ID: Tina Powers, female   DOB: 11-01-1951, 64 y.o.   MRN: [id number] A copy of the notes for this visit, including all labs and xray results, were faxed to Dr. Zebedee Iba at [facility] per patient's request.

## 2016-01-23 IMAGING — DX DG CHEST 2V
2 series · 2 of 2 positions shown · non-contrast
Comparison: 02/13/2015

CLINICAL DATA: Cough and chest congestion for 2 weeks.

EXAM:
CHEST  2 VIEW

[chest pa]
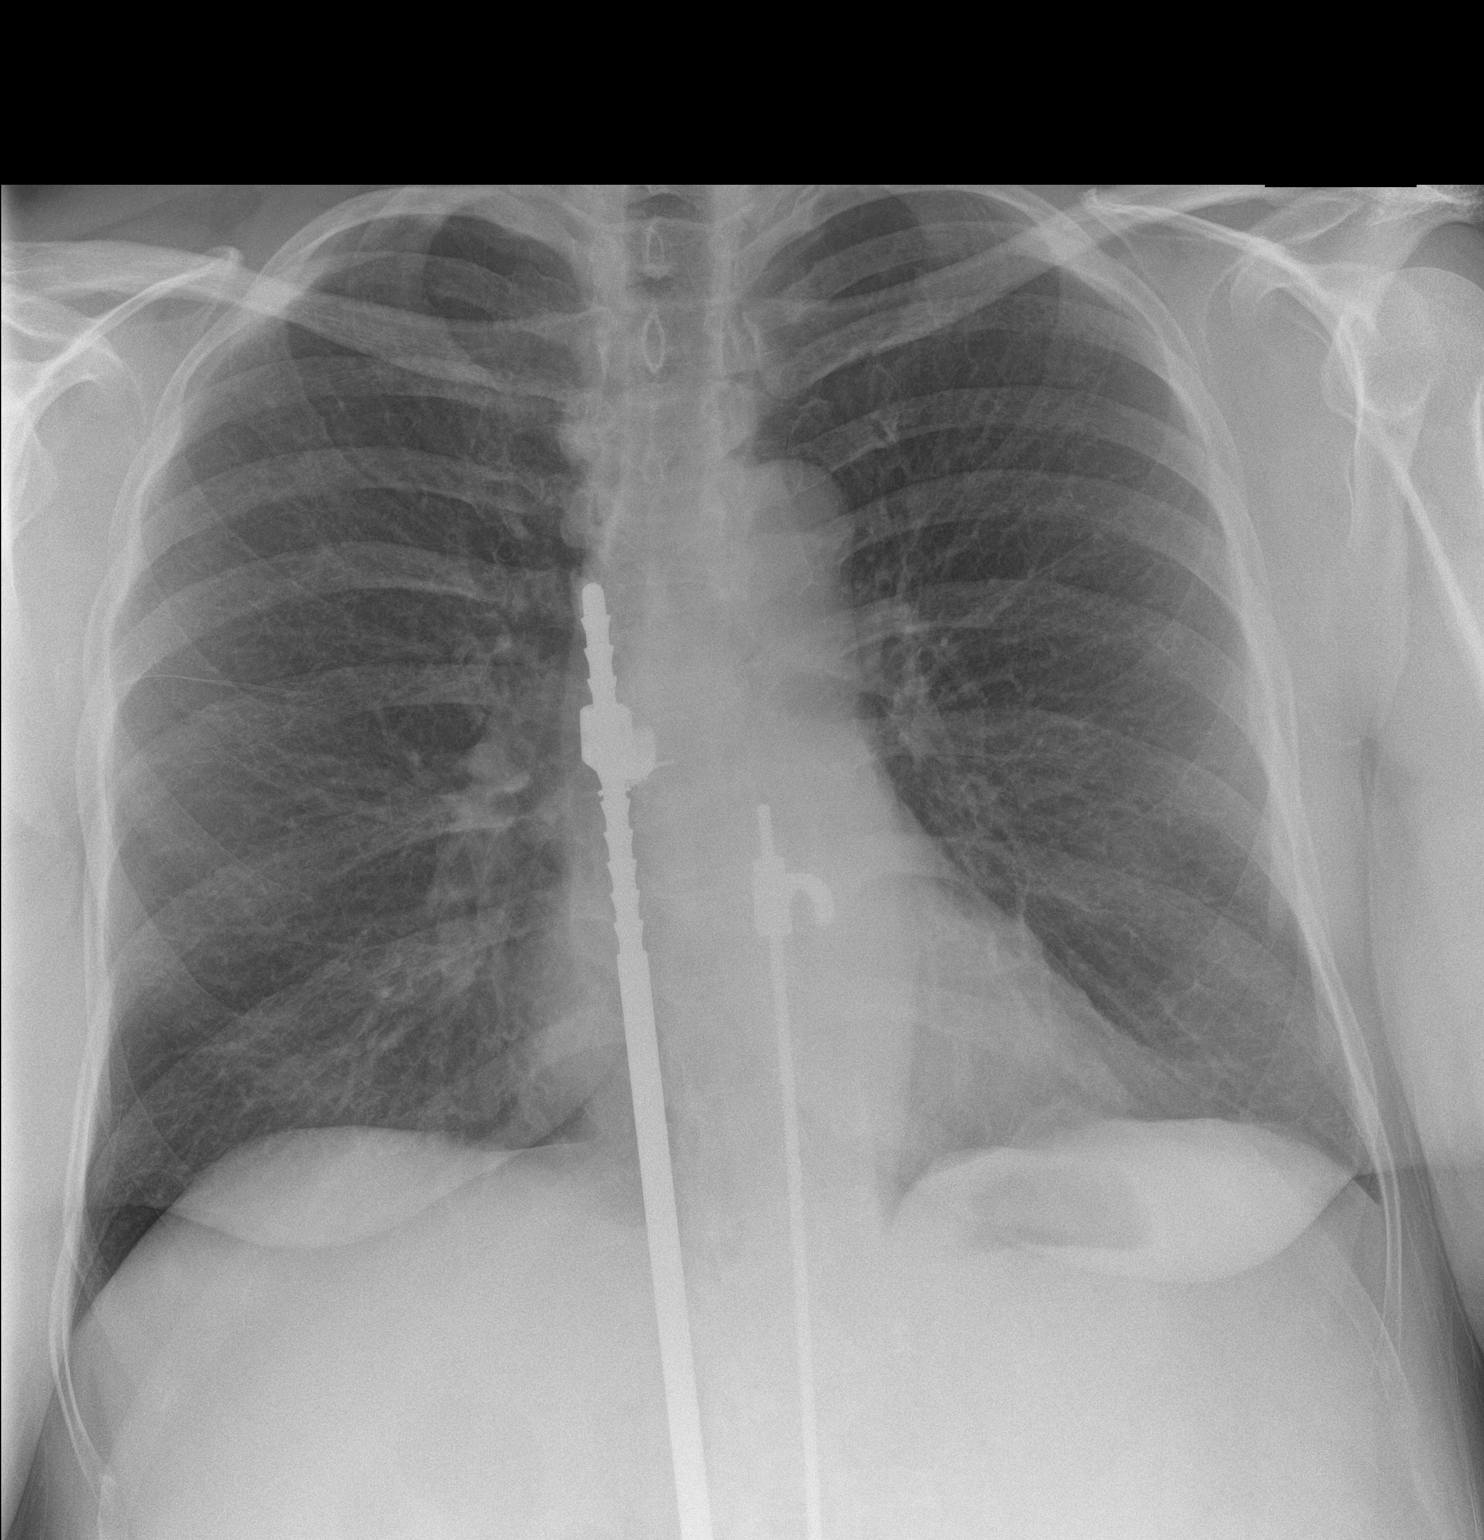

[chest lat]
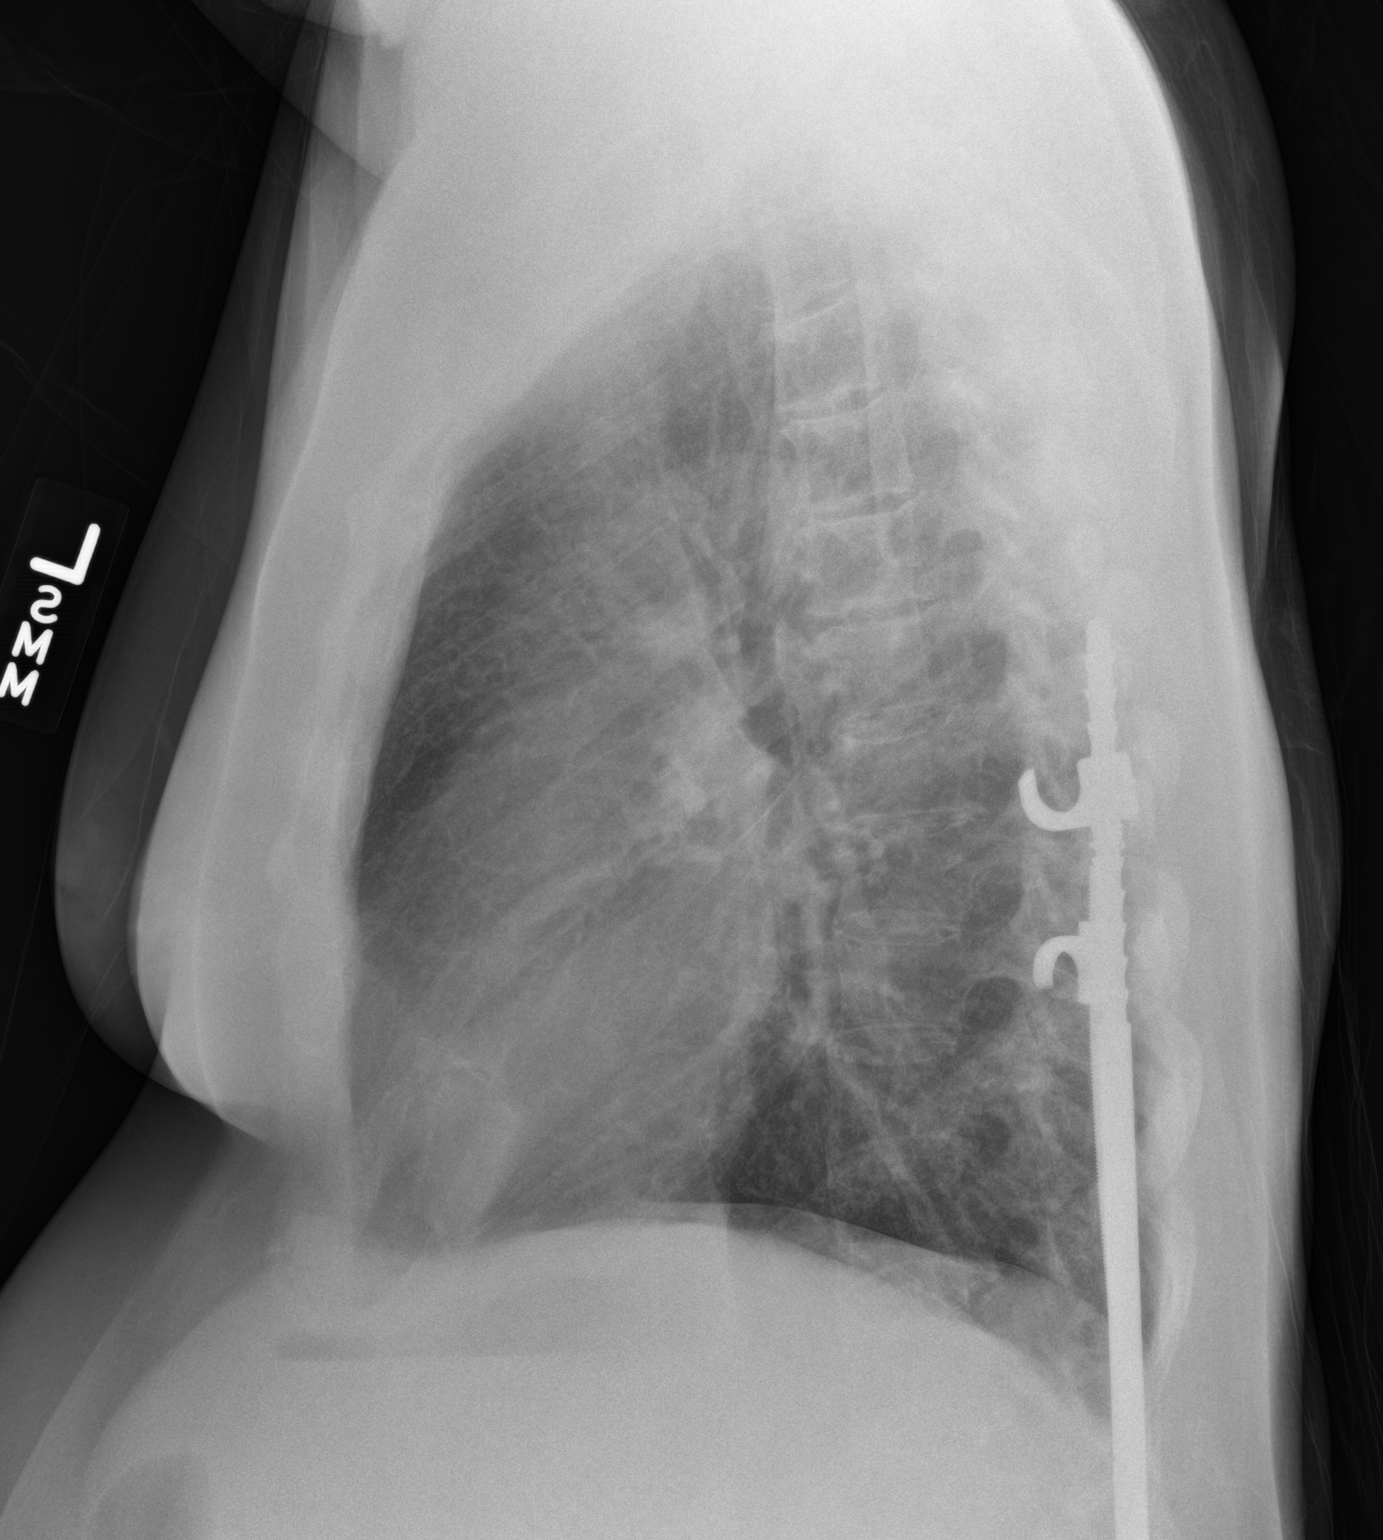

[2 of 2 positions shown; findings below may reference images not displayed]

FINDINGS: The heart size and mediastinal contours are within normal limits.
Mild central peribronchial thickening again noted. No evidence of
pulmonary infiltrate or pleural effusion. No mass or lymphadenopathy
identified. Thoracolumbar posterior spinal fixation rods again seen.
IMPRESSION: No active cardiopulmonary disease.

## 2016-04-25 IMAGING — US US EXTREM  UP VENOUS*R*
1 series · 13 of 24 positions shown · non-contrast
Comparison: None.

CLINICAL DATA: Soft tissue swelling and pain in the distal right
upper arm region.

EXAM:
RIGHT UPPER EXTREMITY VENOUS DUPLEX ULTRASOUND
TECHNIQUE: Gray-scale sonography with graded compression, as well as color
Doppler and duplex ultrasound were performed to evaluate the right
upper extremity deep venous system from the level of the subclavian
vein and including the jugular, axillary, basilic, radial, ulnar and
upper cephalic vein. Spectral Doppler was utilized to evaluate flow
at rest and with distal augmentation maneuvers.

[Series 1: us extrem up venous*right* · 0.07mm/px · 13 of 46 slices shown]
[im 1/46]
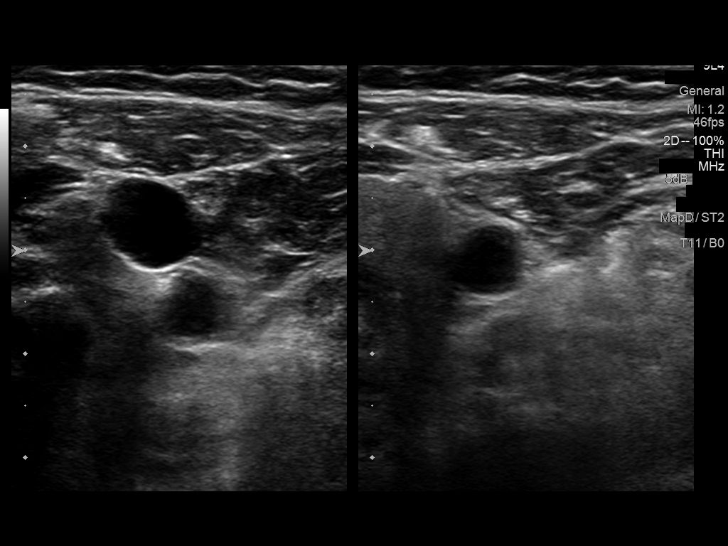
[im 4/46]
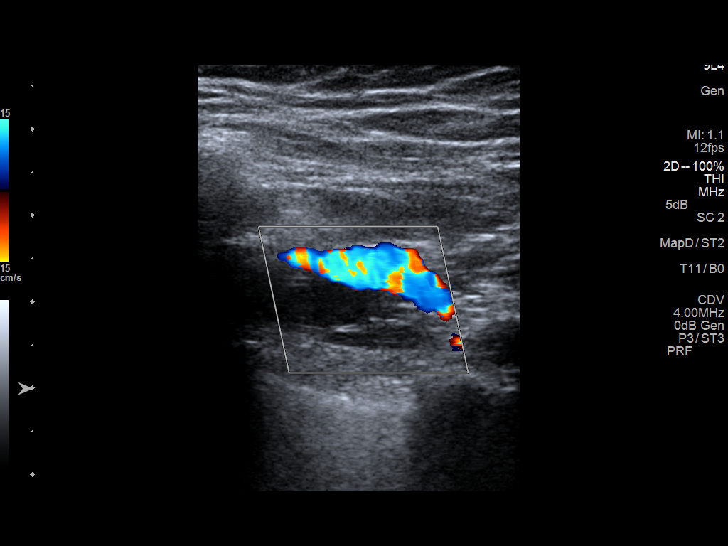
[im 8/46]
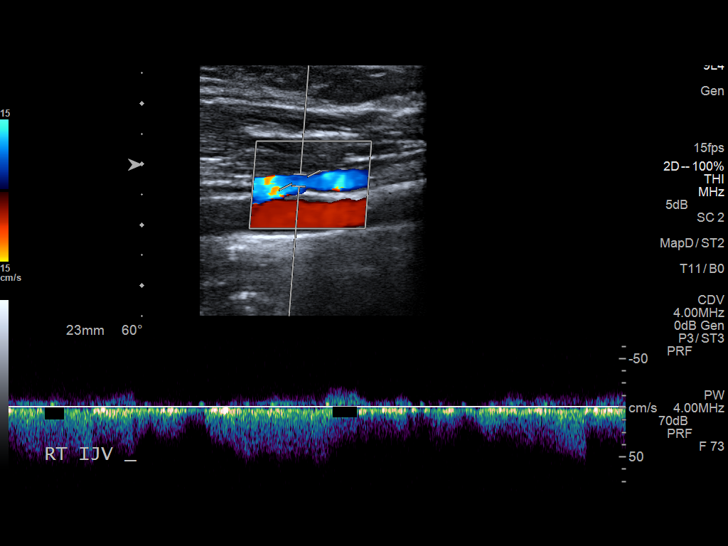
[im 12/46]
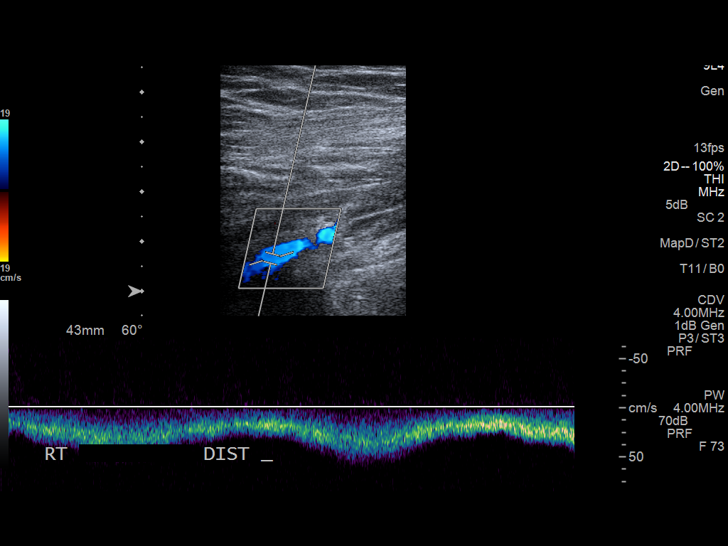
[im 16/46]
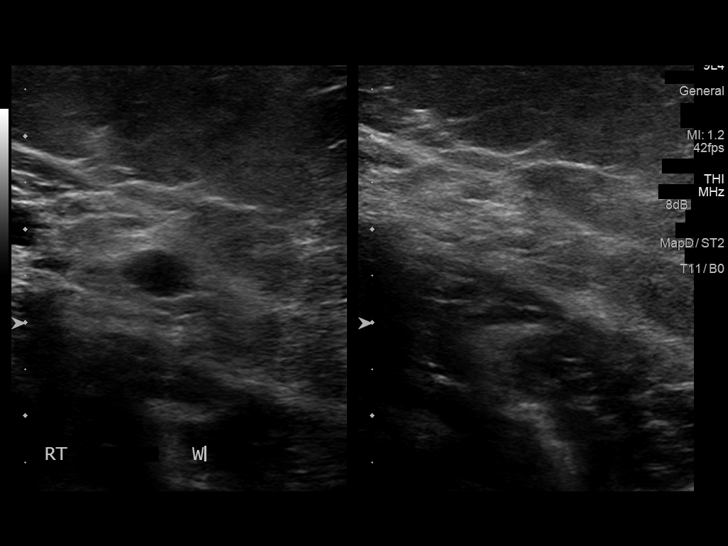
[im 20/46]
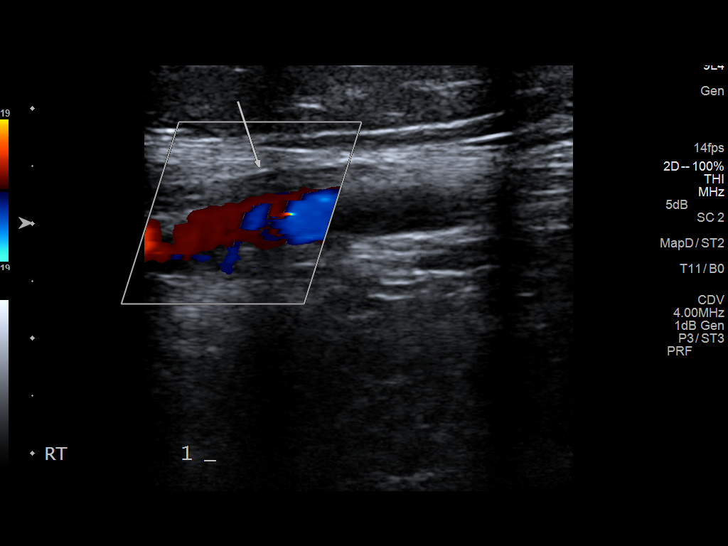
[im 24/46]
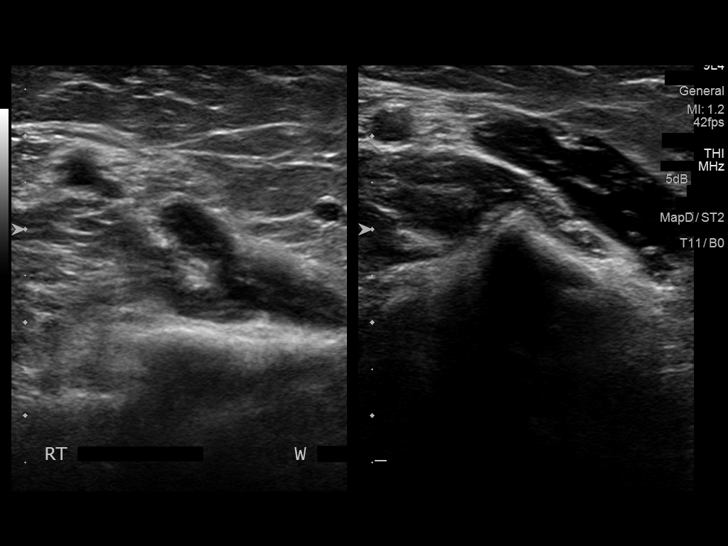
[im 26/46]
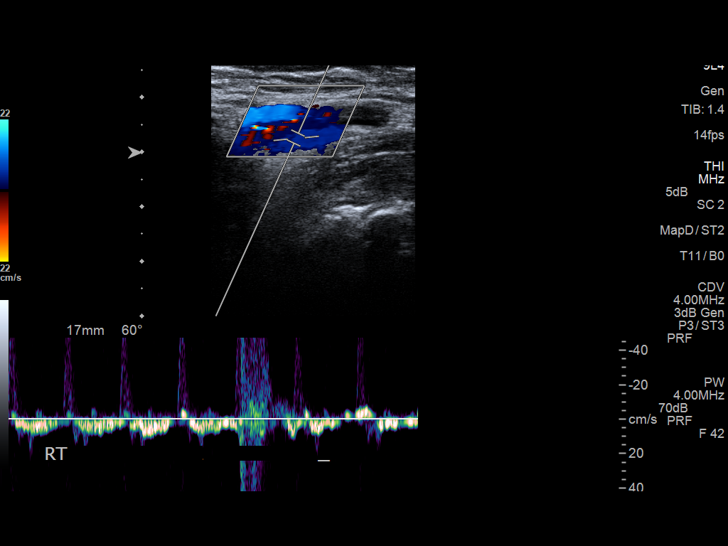
[im 30/46]
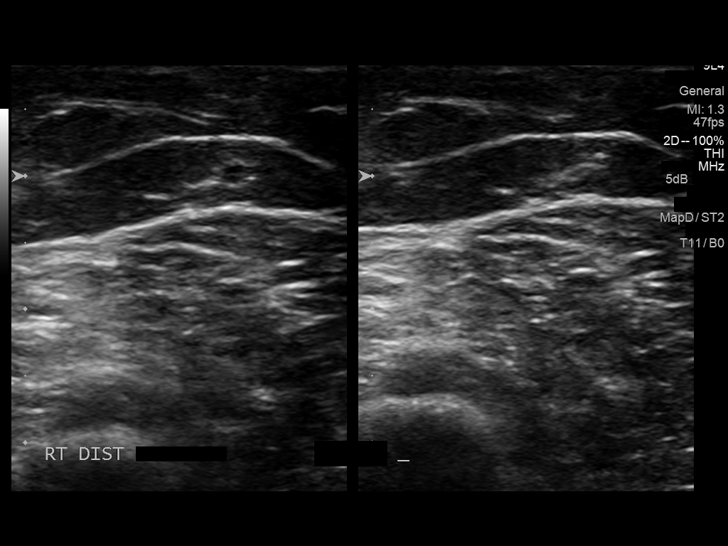
[im 34/46]
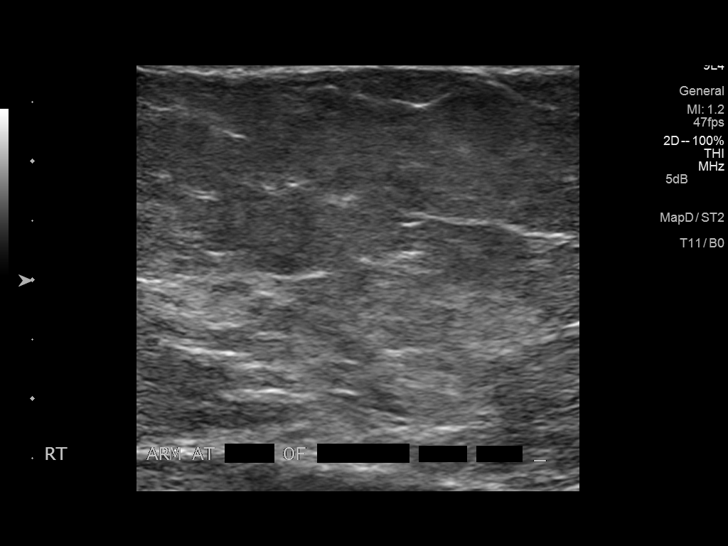
[im 38/46]
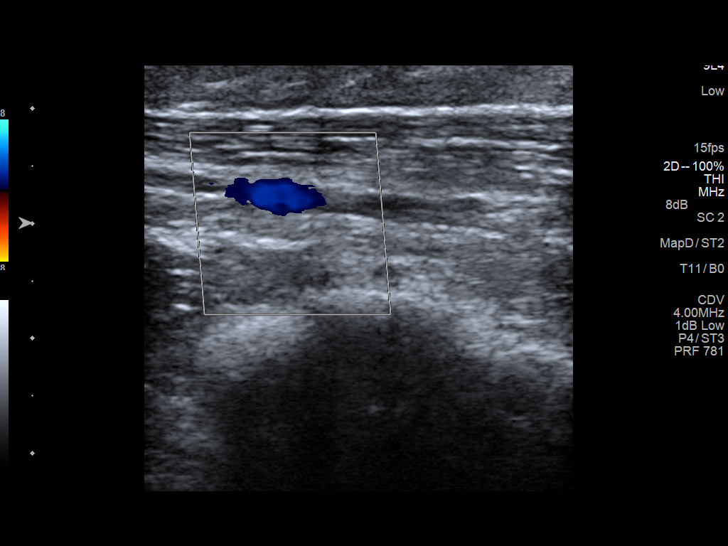
[im 42/46]
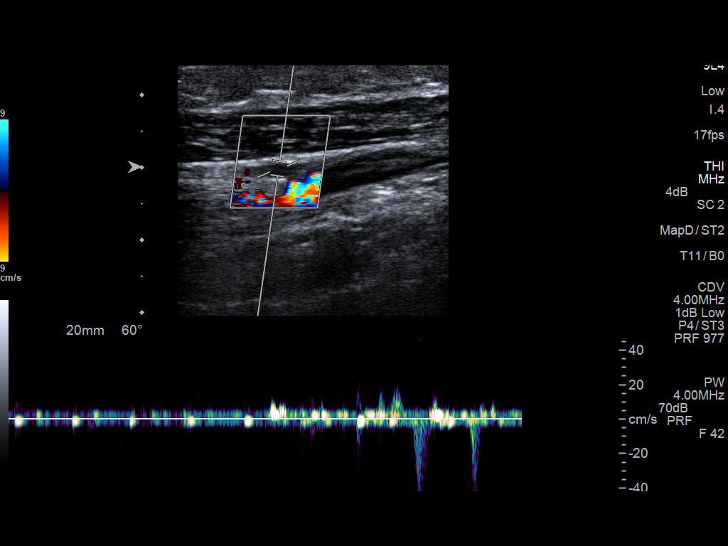
[im 46/46]
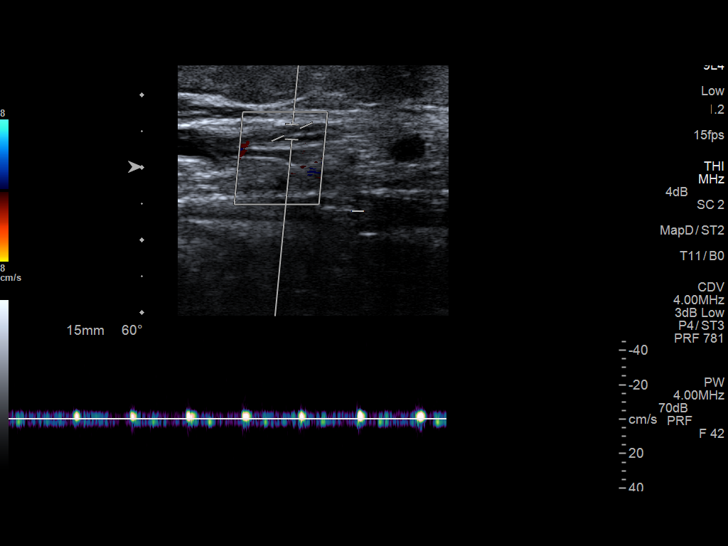

[13 of 24 positions shown; findings below may reference images not displayed]

FINDINGS: Contralateral Subclavian Vein: Respiratory phasicity is normal and
symmetric with the symptomatic side. No evidence of thrombus. Normal
compressibility.

Internal Jugular Vein: No evidence of thrombus. Normal
compressibility, respiratory phasicity and response to augmentation.

Subclavian Vein: No evidence of thrombus. Normal compressibility,
respiratory phasicity and response to augmentation.

Axillary Vein: No evidence of thrombus. Normal compressibility,
respiratory phasicity and response to augmentation.

Cephalic Vein: No evidence of thrombus. Normal compressibility,
respiratory phasicity and response to augmentation.

Basilic Vein: No evidence of thrombus. Normal compressibility,
respiratory phasicity and response to augmentation.

Brachial Veins: There are duplicated portions of the right brachium
vein. In one of the limbs of the mid to distal right brachials vein,
there is acute thrombus with decreased attenuation material
throughout this region. There is lack of augmentation and
compressibility in this area.

Radial Veins: No evidence of thrombus. Normal compressibility,
respiratory phasicity and response to augmentation.

Ulnar Veins: No evidence of thrombus. Normal compressibility,
respiratory phasicity and response to augmentation.

Venous Reflux:  None visualized.

Other Findings:  None visualized.
IMPRESSION: Acute deep venous thrombosis in a portion of one of the branches of
the mid to distal right brachial vein. No other acute deep venous
thrombosis seen. Study otherwise unremarkable.

These results will be called to the ordering clinician or
representative by the Radiologist Assistant, and communication
documented in the PACS or zVision Dashboard.

## 2016-06-28 ENCOUNTER — Encounter: Payer: Self-pay | Admitting: Physician Assistant

## 2016-06-28 ENCOUNTER — Ambulatory Visit: Payer: Self-pay | Admitting: Physician Assistant

## 2016-06-28 VITALS — BP 130/70 | HR 95 | Temp 98.4°F

## 2016-06-28 DIAGNOSIS — J069 Acute upper respiratory infection, unspecified: Secondary | ICD-10-CM

## 2016-06-28 MED ORDER — AMOXICILLIN 875 MG PO TABS: 875.0000 mg | ORAL_TABLET | Freq: Two times a day (BID) | ORAL | Status: DC

## 2016-06-28 NOTE — Progress Notes (Signed)
S: C/o sore throat and congestion for 3 days, no fever, chills, cp/sob, v/d; mucus was green this am but clear throughout the day, cough is sporadic, also some rlq pain, started 2 nights ago, is not as bad as it was then, states it was sharp pain  Using otc meds: robitussin  O: PE: vitals wnl, nad, perrl eomi, normocephalic, tms dull, nasal mucosa red and swollen, throat injected, neck supple no lymph, lungs c t a, cv rrr, neuro intact  A:  Acute uri, rlq   P: amoxil, drink fluids, continue regular meds , use otc meds of choice, return if not improving in 5 days, return earlier if worsening , go to ER if rlq pain increased

## 2016-07-02 ENCOUNTER — Ambulatory Visit: Payer: Self-pay | Admitting: Physician Assistant

## 2016-07-02 VITALS — BP 129/75 | HR 97 | Temp 99.8°F

## 2016-07-02 DIAGNOSIS — H6982 Other specified disorders of Eustachian tube, left ear: Secondary | ICD-10-CM

## 2016-07-02 NOTE — Progress Notes (Signed)
S: taking amoxil for ear infection, had bilat ear pain with left rad down neck.  "feels like water" also popping and cracking O: right TM with min effusion, left TM with mod effusion and min erythema, dull.  Nose boggy, throat with min drainage.  Lungs cl A: eustachian tube dysfunction P flonase and prednisone 30mg  x 3 days

## 2016-07-11 ENCOUNTER — Encounter: Payer: Self-pay | Admitting: Physician Assistant

## 2016-07-11 ENCOUNTER — Ambulatory Visit: Payer: Self-pay | Admitting: Physician Assistant

## 2016-07-11 VITALS — BP 139/90 | HR 87 | Temp 98.2°F

## 2016-07-11 DIAGNOSIS — H68003 Unspecified Eustachian salpingitis, bilateral: Secondary | ICD-10-CM

## 2016-07-11 MED ORDER — FEXOFENADINE-PSEUDOEPHED ER 60-120 MG PO TB12: 1.0000 | ORAL_TABLET | Freq: Two times a day (BID) | ORAL | Status: DC

## 2016-07-11 NOTE — Progress Notes (Signed)
   Subjective:    Patient ID: Tina Powers, female    DOB: 1951/08/11, 65 y.o.   MRN: [id number]  HPI Patient c/o bilateral ear pressure and decreased hearing for 2 days. S/p treatment for URI consisting of Amoxil and Prednisone. Denies fever or cough. Denies vertigo. States intermitting post nasal drainage. No palliative measure for this compliant.   Review of Systems HTN    Objective:   Physical Exam  HEENT remarkable for bilateral edematous but non-erythematous TMs. Neck supple, Lungs CTA, and Heart RRR.      Assessment & Plan:Eustachian tube dysfunction  Continue previous medication and take Allergra-D. Follow up 5 days.

## 2016-07-16 ENCOUNTER — Ambulatory Visit: Payer: Self-pay | Admitting: Registered Nurse

## 2016-07-16 VITALS — BP 140/98 | HR 91 | Temp 98.1°F

## 2016-07-16 DIAGNOSIS — H65193 Other acute nonsuppurative otitis media, bilateral: Secondary | ICD-10-CM

## 2016-07-16 DIAGNOSIS — J0120 Acute ethmoidal sinusitis, unspecified: Secondary | ICD-10-CM

## 2016-07-16 MED ORDER — DOXYCYCLINE HYCLATE 100 MG PO TABS: 100.0000 mg | ORAL_TABLET | Freq: Two times a day (BID) | ORAL | 0 refills | Status: DC

## 2016-07-16 MED ORDER — SALINE SPRAY 0.65 % NA SOLN: 2.0000 | NASAL | 0 refills | Status: DC | PRN

## 2016-07-16 NOTE — Progress Notes (Signed)
Subjective:    Patient ID: Tina Powers, female    DOB: 1951-07-12, 65 y.o.   MRN: [id number]  64y/o caucasian female here for re-evaluation ear pain s/p amoxicillin, flonase, prednisone oral, atrovent nasal and allegra D still having ear fullness and pain  Denied headache, ear discharge.  Stated post nasal drip/sore throat resolved with allegra D  3rd visit this month has seen Manuela Schwartz and Ron PAs earlier this month      Review of Systems  Constitutional: Negative for activity change, appetite change and fever.  HENT: Positive for congestion, ear pain, hearing loss, postnasal drip, sinus pressure and sore throat. Negative for ear discharge, facial swelling, mouth sores, nosebleeds, rhinorrhea, sneezing, tinnitus, trouble swallowing and voice change.   Eyes: Negative for photophobia, pain, discharge, redness, itching and visual disturbance.  Respiratory: Negative for cough, shortness of breath and wheezing.   Cardiovascular: Negative for chest pain and palpitations.  Gastrointestinal: Positive for diarrhea. Negative for nausea and vomiting.  Endocrine: Negative for cold intolerance and heat intolerance.  Genitourinary: Negative for difficulty urinating.  Musculoskeletal: Negative for back pain and gait problem.  Skin: Negative for color change, pallor, rash and wound.  Allergic/Immunologic: Positive for environmental allergies. Negative for food allergies.  Neurological: Negative for dizziness, tremors, seizures, syncope, facial asymmetry, speech difficulty, weakness, light-headedness and headaches.  Hematological: Negative for adenopathy. Does not bruise/bleed easily.  Psychiatric/Behavioral: Positive for sleep disturbance.       Objective:   Physical Exam  Constitutional: She is oriented to person, place, and time. She appears well-developed and well-nourished.  Non-toxic appearance. She does not have a sickly appearance. She does not appear ill. No distress.  HENT:  Head:  Normocephalic.  Right Ear: Hearing, external ear and ear canal normal. Tympanic membrane is injected, erythematous and bulging. A middle ear effusion is present.  Left Ear: Hearing, external ear and ear canal normal. Tympanic membrane is injected, erythematous and bulging. A middle ear effusion is present.  Nose: Mucosal edema present. No rhinorrhea, nose lacerations, sinus tenderness, nasal deformity, septal deviation or nasal septal hematoma. No epistaxis.  No foreign bodies. Right sinus exhibits no maxillary sinus tenderness and no frontal sinus tenderness. Left sinus exhibits no maxillary sinus tenderness and no frontal sinus tenderness.  Mouth/Throat: Uvula is midline and mucous membranes are normal. She does not have dentures. No oral lesions. No trismus in the jaw. Normal dentition. No dental abscesses, uvula swelling, lacerations or dental caries. Posterior oropharyngeal edema and posterior oropharyngeal erythema present. No oropharyngeal exudate or tonsillar abscesses.  Cobblestoning posterior pharynx malampati IV airway bilateral TMs with air fluid level clear bulging TMs with vascular excoriation, erythema bilateral nasal turbinates with edema/erythema clear discharge bilateral allergic shiners; bridge of nose TTP  Eyes: Conjunctivae and EOM are normal. Pupils are equal, round, and reactive to light. Right eye exhibits no discharge. Left eye exhibits no discharge. No scleral icterus.  Neck: Normal range of motion. Neck supple. No tracheal deviation present. No thyromegaly present.  Cardiovascular: Normal rate, regular rhythm, normal heart sounds and intact distal pulses.  PMI is not displaced.  Exam reveals no gallop and no friction rub.   No murmur heard. Pulmonary/Chest: Effort normal and breath sounds normal. No accessory muscle usage or stridor. No respiratory distress. She has no decreased breath sounds. She has no wheezes. She has no rhonchi. She has no rales.  Abdominal: Soft. Normal  appearance. She exhibits no distension.  Musculoskeletal: Normal range of motion.  Right shoulder: Normal.       Left shoulder: Normal.       Right elbow: Normal.      Left elbow: Normal.       Right hip: Normal.       Left hip: Normal.       Right knee: Normal.       Left knee: Normal.       Cervical back: Normal.       Right hand: Normal.       Left hand: Normal.  Lymphadenopathy:       Head (right side): No submental, no submandibular, no tonsillar, no preauricular, no posterior auricular and no occipital adenopathy present.       Head (left side): No submental, no submandibular, no tonsillar, no preauricular, no posterior auricular and no occipital adenopathy present.    She has no cervical adenopathy.       Right cervical: No superficial cervical, no deep cervical and no posterior cervical adenopathy present.      Left cervical: No superficial cervical, no deep cervical and no posterior cervical adenopathy present.  Neurological: She is alert and oriented to person, place, and time. She has normal reflexes. She displays no atrophy and no tremor. No cranial nerve deficit. She exhibits normal muscle tone. She displays no seizure activity. Coordination and gait normal. GCS eye subscore is 4. GCS verbal subscore is 5. GCS motor subscore is 6.  Skin: Skin is warm and dry. No rash noted. She is not diaphoretic. No erythema. No pallor.  Psychiatric: She has a normal mood and affect. Her behavior is normal. Thought content normal.  Nursing note and vitals reviewed.         Assessment & Plan:  A-bilateral acute nonsupportive otitis media recurrent; seasonal allergic rhinitis; acute ethmoid sinusitis recurrence not specified  Supportive treatment.  Contine allegra D po daily, flonase 1 spray each nostril BID; add nasal saline prn consider singulair 10mg  po qhs if no improvement with changing flonase administration currently tasting in back of mouth smaller sniff and shower BID   Tylenol 1000mg  po QID prn pain  Consider ENT consultation if no improvement with doxycycline.  Stop atrovent nasal spray.  Has failed amoxicillin and prednisone.  Doxycycline 100mg  po BID x 10 days.   No evidence of invasive bacterial infection, non toxic and well hydrated.  This is most likely self limiting viral infection.  I do not see where any further testing or imaging is necessary at this time.   I will suggest supportive care, rest, good hygiene and encourage the patient to take adequate fluids.  The patient is to return to clinic or EMERGENCY ROOM if symptoms worsen or change significantly e.g. ear pain, fever, purulent discharge from ears or bleeding.  Patient verbalized agreement and understanding of treatment plan.     Restart flonase 1 spray each nostril BID, saline 2 sprays each nostril q2h prn congestion.  If no improvement with 48 hours of saline and flonase use start doxycycline 100mg  po BID x 10 days.  Rx given.  No evidence of systemic bacterial infection, non toxic and well hydrated.  I do not see where any further testing or imaging is necessary at this time.   I will suggest supportive care, rest, good hygiene and encourage the patient to take adequate fluids.  The patient is to return to clinic or EMERGENCY ROOM if symptoms worsen or change significantly.  Patient verbalized agreement and understanding of treatment plan and had  no further questions at this time.   P2:  Hand washing and cover cough

## 2016-09-03 ENCOUNTER — Ambulatory Visit: Payer: Self-pay | Admitting: Physician Assistant

## 2016-09-03 ENCOUNTER — Encounter: Payer: Self-pay | Admitting: Physician Assistant

## 2016-09-03 VITALS — HR 85 | Temp 98.5°F

## 2016-09-03 DIAGNOSIS — J069 Acute upper respiratory infection, unspecified: Secondary | ICD-10-CM

## 2016-09-03 MED ORDER — FLUCONAZOLE 150 MG PO TABS: 150.0000 mg | ORAL_TABLET | Freq: Once | ORAL | 0 refills | Status: AC

## 2016-09-03 MED ORDER — CEFDINIR 300 MG PO CAPS: 300.0000 mg | ORAL_CAPSULE | Freq: Two times a day (BID) | ORAL | 0 refills | Status: DC

## 2016-09-03 NOTE — Progress Notes (Signed)
S: C/o runny nose and congestion for 3 days, no fever, chills, cp/sob, v/d; mucus was green this am but clear throughout the day, cough is sporadic,   Using otc meds: robitussin  O: PE: vitals wnl, nad, perrl eomi, normocephalic, tms dull, nasal mucosa red and swollen, throat injected, neck supple no lymph, lungs c t a, cv rrr, neuro intact  A:  Acute uri   P: drink fluids, continue regular meds , use otc meds of choice, return if not improving in 5 days, return earlier if worsening . omnicef 300mg  bid, diflucan if needed

## 2016-10-16 ENCOUNTER — Ambulatory Visit: Payer: Self-pay | Admitting: Physician Assistant

## 2016-10-16 ENCOUNTER — Encounter: Payer: Self-pay | Admitting: Physician Assistant

## 2016-10-16 VITALS — HR 84 | Temp 98.6°F

## 2016-10-16 DIAGNOSIS — T63304A Toxic effect of unspecified spider venom, undetermined, initial encounter: Secondary | ICD-10-CM

## 2016-10-16 MED ORDER — MUPIROCIN 2 % EX OINT: TOPICAL_OINTMENT | CUTANEOUS | 0 refills | Status: DC

## 2016-10-16 NOTE — Progress Notes (Signed)
S: states she thinks she has a spider bite on her abdomen, been there for about 2 weeks, no fever/chills, no pus or drainage, area is red with yellow center, not painful  O: vitals wnl, nad, skin with quarter sized round lesion with yellow center and raised red edge, no pustules or drainage, no induration, nontender, n/v intact  A: spider bite  P: bactroban ointment and hydrocortisone cream, if increasing in size return to clinic

## 2016-11-11 IMAGING — CR DG ABDOMEN 1V
1 series · 2 of 2 positions shown · non-contrast
Comparison: None.

CLINICAL DATA: Constipation for 3 weeks.

EXAM:
ABDOMEN - 1 VIEW

[Series 1: dg abd 1 view · 0.14mm/px · 2 of 2 slices shown]
[im 1/2]
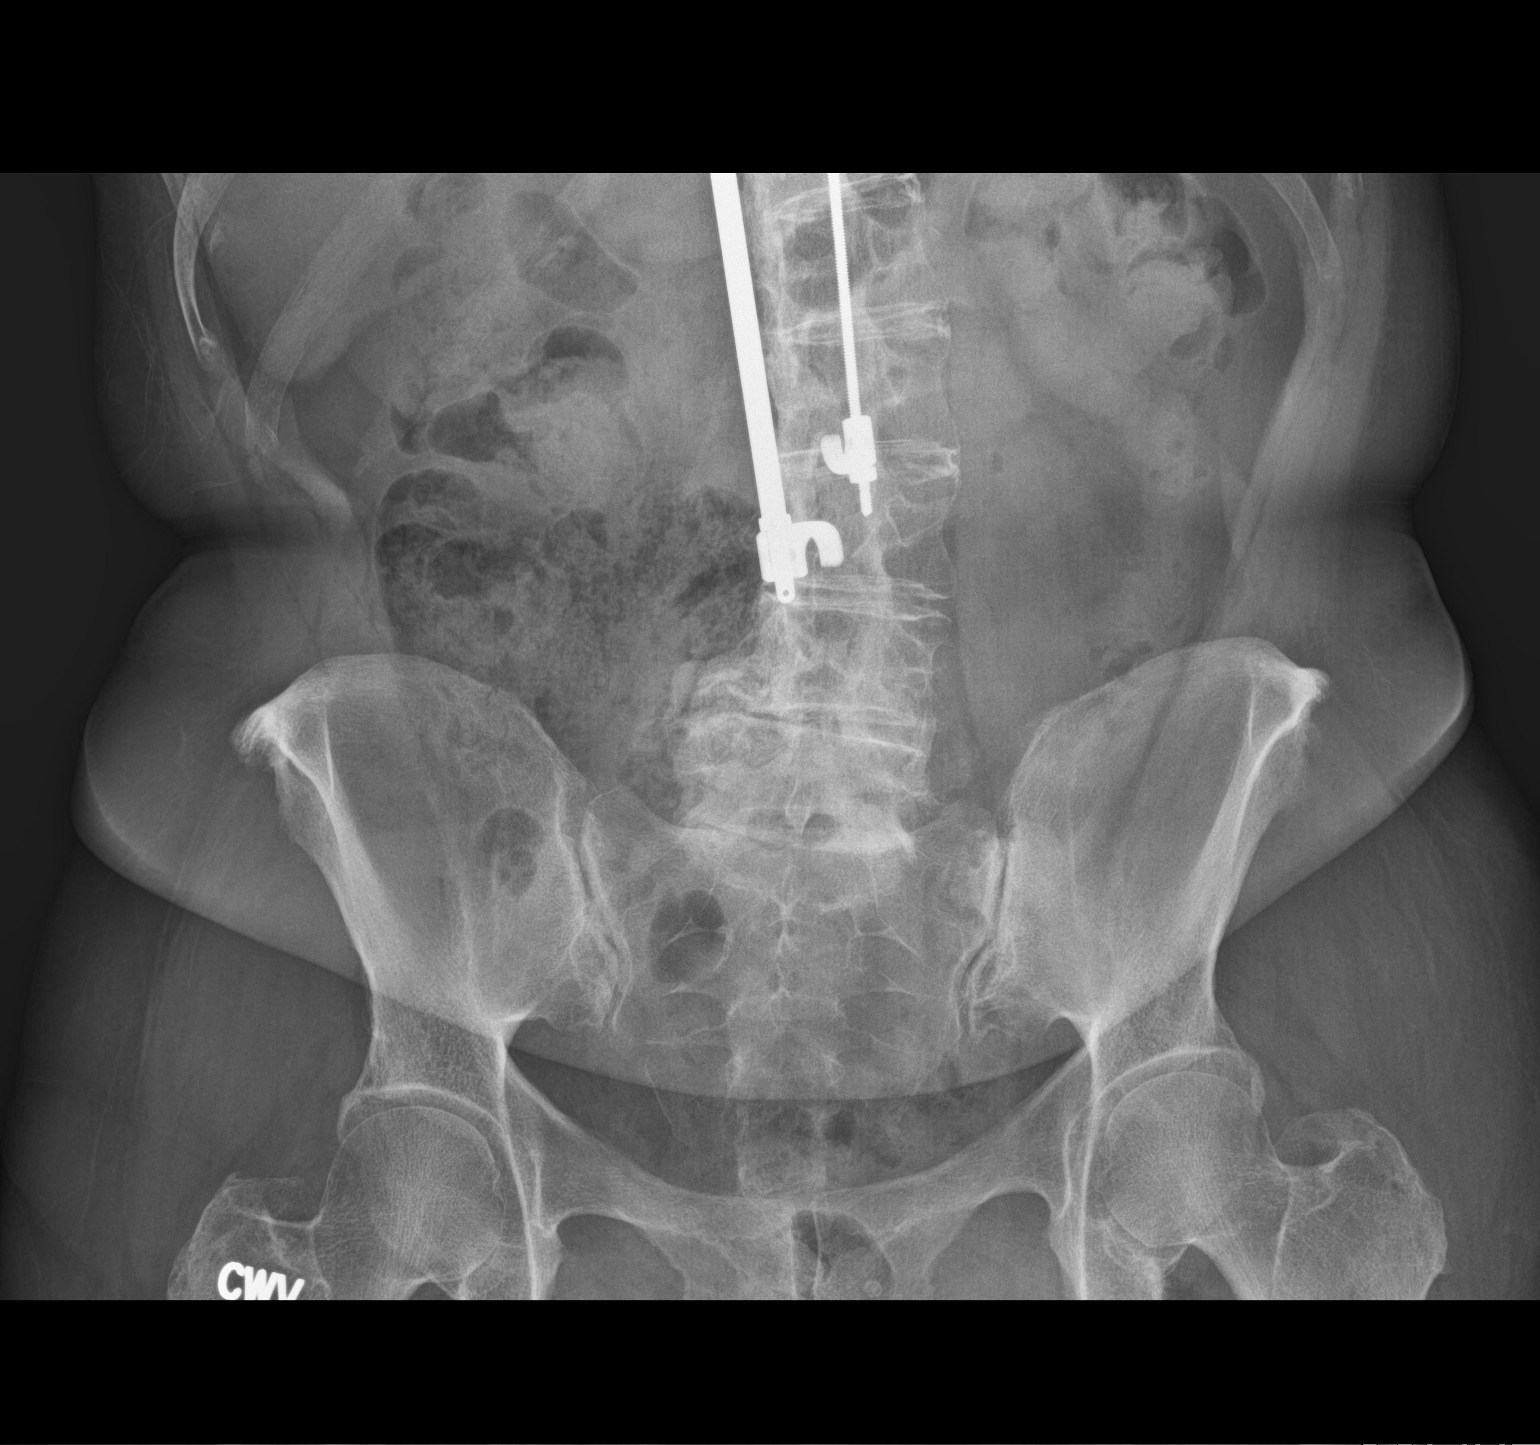
[im 2/2]
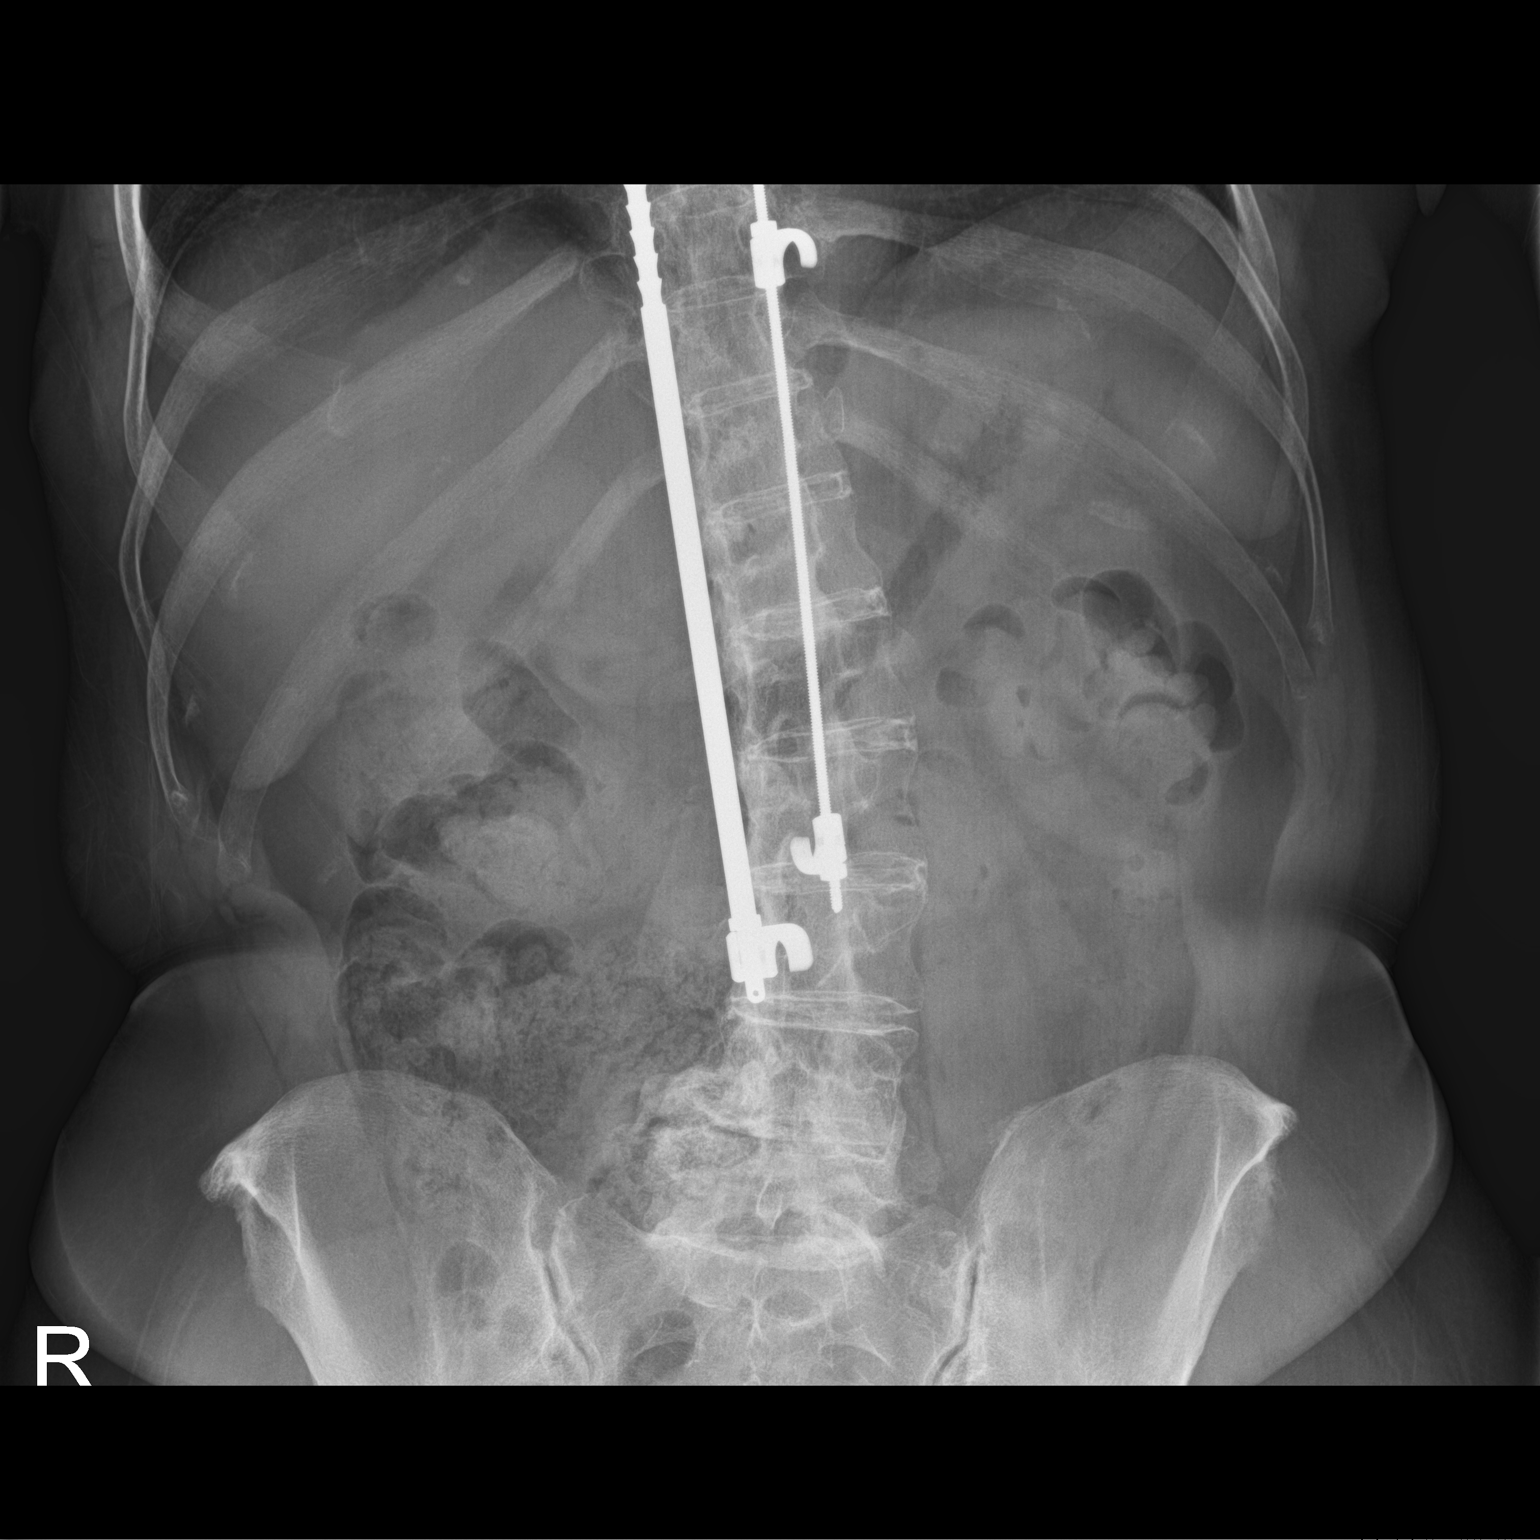

[2 of 2 positions shown; findings below may reference images not displayed]

FINDINGS: Large amount of stool is noted in the right and transverse colon. No
abnormal bowel dilatation is noted. Degenerative and postsurgical
changes are seen involving the lumbar spine. No abnormal
calcifications are noted.
IMPRESSION: Large amount of stool is noted in the right and transverse colon
suggesting constipation. No abnormal bowel dilatation is noted.

## 2017-01-14 ENCOUNTER — Ambulatory Visit: Payer: Self-pay | Admitting: Physician Assistant

## 2017-01-14 VITALS — BP 130/80 | HR 89 | Temp 98.7°F

## 2017-01-14 DIAGNOSIS — J069 Acute upper respiratory infection, unspecified: Secondary | ICD-10-CM

## 2017-01-14 DIAGNOSIS — H60399 Other infective otitis externa, unspecified ear: Secondary | ICD-10-CM

## 2017-01-14 DIAGNOSIS — J018 Other acute sinusitis: Secondary | ICD-10-CM

## 2017-01-14 LAB — POCT INFLUENZA A/B
INFLUENZA B, POC: NEGATIVE
Influenza A, POC: NEGATIVE

## 2017-01-15 NOTE — Progress Notes (Signed)
See notes by Bethann Humble FNP .

## 2017-01-28 ENCOUNTER — Ambulatory Visit: Payer: Self-pay | Admitting: Physician Assistant

## 2017-01-28 ENCOUNTER — Encounter: Payer: Self-pay | Admitting: Physician Assistant

## 2017-01-28 VITALS — HR 91 | Temp 97.5°F

## 2017-01-28 DIAGNOSIS — N390 Urinary tract infection, site not specified: Secondary | ICD-10-CM

## 2017-01-28 DIAGNOSIS — R102 Pelvic and perineal pain: Secondary | ICD-10-CM

## 2017-01-28 DIAGNOSIS — R319 Hematuria, unspecified: Secondary | ICD-10-CM

## 2017-01-28 LAB — POCT URINALYSIS DIPSTICK
BILIRUBIN UA: NEGATIVE
Glucose, UA: NEGATIVE
KETONES UA: NEGATIVE
NITRITE UA: NEGATIVE
PH UA: 5.5
Spec Grav, UA: 1.025
Urobilinogen, UA: 0.2

## 2017-01-28 MED ORDER — SULFAMETHOXAZOLE-TRIMETHOPRIM 800-160 MG PO TABS: 1.0000 | ORAL_TABLET | Freq: Two times a day (BID) | ORAL | 0 refills | Status: DC

## 2017-01-28 NOTE — Progress Notes (Addendum)
S:  C/o uti sx for 2 days, burning, urgency, frequency, denies vaginal discharge, abdominal pain or flank pain: did have some nausea;  Remainder ros neg  O:  Vitals wnl, nad, no cva tenderness, lungs c t a, cv rrr  A: uti  P: septra ds bid x 7d, increase water intake, add cranberry juice, return if not improving in 2 -3 days, return earlier if worsening, discussed pyelonephritis sx, will add urine culture  Urine culture showed e. Coli and is resistant to septra, sent rx for macrobid to [facility] [address]

## 2017-01-30 LAB — URINE CULTURE

## 2017-01-30 MED ORDER — NITROFURANTOIN MONOHYD MACRO 100 MG PO CAPS: 100.0000 mg | ORAL_CAPSULE | Freq: Two times a day (BID) | ORAL | 0 refills | Status: DC

## 2017-01-30 NOTE — Addendum Note (Signed)
Addended by: Versie Starks on: 01/30/2017 02:58 PM   Modules accepted: Orders

## 2017-02-05 ENCOUNTER — Ambulatory Visit: Payer: Self-pay | Admitting: Physician Assistant

## 2017-02-05 DIAGNOSIS — N390 Urinary tract infection, site not specified: Secondary | ICD-10-CM

## 2017-02-05 LAB — POCT URINALYSIS DIPSTICK
Bilirubin, UA: NEGATIVE
Blood, UA: NEGATIVE
Glucose, UA: NEGATIVE
Nitrite, UA: NEGATIVE
PH UA: 5.5
Spec Grav, UA: >=1.03
Urobilinogen, UA: 0.2

## 2017-02-05 MED ORDER — FLUCONAZOLE 150 MG PO TABS: ORAL_TABLET | ORAL | 0 refills | Status: DC

## 2017-02-05 MED ORDER — AMOXICILLIN 875 MG PO TABS: 875.0000 mg | ORAL_TABLET | Freq: Two times a day (BID) | ORAL | 0 refills | Status: DC

## 2017-02-05 NOTE — Progress Notes (Signed)
S: here for recheck of urine, says still having some burning up in bladder, had one episode of bloody stools this morning, bright red on tp and a little in the toilet, next bm was normal, no fever/chills/no v/d Has one macrobid pill left  O: vitals wnl, nad, ua trace leuks, trace ketones, trace protein  A: uti  P: amoxil 875 bid x 5d, diflucan, will culture urine, recheck urine when finished with amoxil

## 2017-02-12 ENCOUNTER — Ambulatory Visit: Payer: Self-pay | Admitting: Physician Assistant

## 2017-02-12 ENCOUNTER — Encounter: Payer: Self-pay | Admitting: Physician Assistant

## 2017-02-12 VITALS — BP 150/85 | HR 92 | Temp 98.5°F

## 2017-02-12 DIAGNOSIS — N390 Urinary tract infection, site not specified: Secondary | ICD-10-CM

## 2017-02-12 DIAGNOSIS — R030 Elevated blood-pressure reading, without diagnosis of hypertension: Secondary | ICD-10-CM

## 2017-02-12 LAB — POCT URINALYSIS DIPSTICK
BILIRUBIN UA: NEGATIVE
GLUCOSE UA: NEGATIVE
KETONES UA: NEGATIVE
Leukocytes, UA: NEGATIVE
Nitrite, UA: NEGATIVE
Protein, UA: NEGATIVE
SPEC GRAV UA: 1.025
UROBILINOGEN UA: 0.2
pH, UA: 5

## 2017-02-12 NOTE — Progress Notes (Signed)
S: pt states she was at her doctor's and her bp was 170/90, they didn't say much about it so she would like to have it rechecked, no cp/sob, also needs ua rechecked since finished with antibiotics  O: vitals w bp 150/85, lungs c t a, cv rrr  A: elevated bp reading  P: recheck on Friday morning, ua wnl,

## 2017-02-15 ENCOUNTER — Ambulatory Visit: Payer: Self-pay | Admitting: Physician Assistant

## 2017-02-15 VITALS — BP 159/79

## 2017-02-15 DIAGNOSIS — R030 Elevated blood-pressure reading, without diagnosis of hypertension: Secondary | ICD-10-CM

## 2017-02-15 NOTE — Progress Notes (Signed)
Patient came in to have her blood pressure re-checked.

## 2017-02-15 NOTE — Progress Notes (Signed)
Seen by RMA , will call to have pt make 3rd visit for bp check

## 2017-04-15 ENCOUNTER — Encounter: Payer: Self-pay | Admitting: Physician Assistant

## 2017-04-15 ENCOUNTER — Ambulatory Visit: Payer: Self-pay | Admitting: Physician Assistant

## 2017-04-15 VITALS — BP 149/90 | HR 79 | Temp 97.8°F | Ht 61.0 in | Wt 156.0 lb

## 2017-04-15 DIAGNOSIS — Z0189 Encounter for other specified special examinations: Secondary | ICD-10-CM

## 2017-04-15 DIAGNOSIS — Z0000 Encounter for general adult medical examination without abnormal findings: Secondary | ICD-10-CM

## 2017-04-15 DIAGNOSIS — Z008 Encounter for other general examination: Secondary | ICD-10-CM

## 2017-04-15 NOTE — Progress Notes (Addendum)
S: pt here for wellness physical and biometrics for insurance purposes, no complaints ros neg. PMH:   Breast CA, hx of high cholesterol does not take meds Social: nonsmoker, no etoh, no drugs, married  Fam: DM, HTN, emphysema, prostate CA, breast CA  O: vitals wnl, nad, ENT wnl, neck supple no lymph, lungs c t a, cv rrr, abd soft nontender bs normal all 4 quads  A: wellness, biometric physical  P: labs for lipid and glucose, hgbA1C, had met c done by GI doctor recently Pt to start cinnamon supplements and recheck bp in 3 weeks

## 2017-04-16 LAB — LIPID PANEL
CHOLESTEROL TOTAL: 287 mg/dL — AB (ref 100–199)
Chol/HDL Ratio: 8.2 ratio — ABNORMAL HIGH (ref 0.0–4.4)
HDL: 35 mg/dL — ABNORMAL LOW (ref >39)
LDL CALC: 186 mg/dL — AB (ref 0–99)
TRIGLYCERIDES: 328 mg/dL — AB (ref 0–149)
VLDL Cholesterol Cal: 66 mg/dL — ABNORMAL HIGH (ref 5–40)

## 2017-04-16 LAB — HEMOGLOBIN A1C
Est. average glucose Bld gHb Est-mCnc: 131 mg/dL
HEMOGLOBIN A1C: 6.2 % — AB (ref 4.8–5.6)

## 2017-04-16 LAB — GLUCOSE, RANDOM: Glucose: 112 mg/dL — ABNORMAL HIGH (ref 65–99)

## 2017-05-24 ENCOUNTER — Ambulatory Visit: Payer: Self-pay | Admitting: Physician Assistant

## 2017-05-24 ENCOUNTER — Encounter: Payer: Self-pay | Admitting: Physician Assistant

## 2017-05-24 VITALS — BP 150/100 | HR 84 | Temp 98.7°F

## 2017-05-24 DIAGNOSIS — N949 Unspecified condition associated with female genital organs and menstrual cycle: Secondary | ICD-10-CM

## 2017-05-24 LAB — POCT URINALYSIS DIPSTICK
Bilirubin, UA: NEGATIVE
Blood, UA: NEGATIVE
Glucose, UA: NEGATIVE
KETONES UA: NEGATIVE
Leukocytes, UA: NEGATIVE
Nitrite, UA: NEGATIVE
PROTEIN UA: NEGATIVE
SPEC GRAV UA: 1.015 (ref 1.010–1.025)
Urobilinogen, UA: 0.2 E.U./dL
pH, UA: 6.5 (ref 5.0–8.0)

## 2017-05-24 NOTE — Addendum Note (Signed)
Addended by: Vassie Loll D on: 05/24/2017 03:19 PM   Modules accepted: Orders

## 2017-05-24 NOTE — Progress Notes (Signed)
S: c/o vaginal burning, no itching or discharge, no fever/chills, no uti sx  O: vitals wnl nad, ua wnl  A: vaginal burning  P: pt to f/u with her pcp or gyn to assess vaginal burning as we do not do any gyn here in clinic, states she understands and will call her doctor, hasn't started taking any meds for cholesterol or htn as instructed

## 2017-06-21 ENCOUNTER — Other Ambulatory Visit: Payer: Self-pay

## 2017-06-21 DIAGNOSIS — Z299 Encounter for prophylactic measures, unspecified: Secondary | ICD-10-CM

## 2017-06-21 NOTE — Progress Notes (Signed)
Patient came in to have blood drawn for testing per Dr. Mr Muss orders.

## 2017-06-22 LAB — B12 AND FOLATE PANEL
FOLATE: 15.1 ng/mL (ref >3.0)
VITAMIN B 12: 314 pg/mL (ref 232–1245)
